# Patient Record
Sex: Male | Born: 1960
Health system: Southern US, Community
[De-identification: ages and names within clinical notes are randomized; demographics above are authoritative.]

## PROBLEM LIST (undated history)

## (undated) DIAGNOSIS — I10 Essential (primary) hypertension: Secondary | ICD-10-CM

## (undated) HISTORY — DX: Essential (primary) hypertension: I10

---

## 2003-09-15 ENCOUNTER — Emergency Department (HOSPITAL_COMMUNITY): Admission: EM | Admit: 2003-09-15 | Discharge: 2003-09-15 | Payer: Self-pay | Admitting: Emergency Medicine

## 2003-10-18 ENCOUNTER — Emergency Department (HOSPITAL_COMMUNITY): Admission: EM | Admit: 2003-10-18 | Discharge: 2003-10-18 | Payer: Self-pay | Admitting: Emergency Medicine

## 2003-10-19 ENCOUNTER — Emergency Department (HOSPITAL_COMMUNITY): Admission: EM | Admit: 2003-10-19 | Discharge: 2003-10-19 | Payer: Self-pay | Admitting: Emergency Medicine

## 2004-06-20 ENCOUNTER — Emergency Department (HOSPITAL_COMMUNITY): Admission: EM | Admit: 2004-06-20 | Discharge: 2004-06-20 | Payer: Self-pay | Admitting: Emergency Medicine

## 2004-07-26 ENCOUNTER — Emergency Department (HOSPITAL_COMMUNITY): Admission: EM | Admit: 2004-07-26 | Discharge: 2004-07-26 | Payer: Self-pay | Admitting: Emergency Medicine

## 2004-07-27 ENCOUNTER — Emergency Department (HOSPITAL_COMMUNITY): Admission: EM | Admit: 2004-07-27 | Discharge: 2004-07-27 | Payer: Self-pay | Admitting: Family Medicine

## 2004-12-18 ENCOUNTER — Emergency Department (HOSPITAL_COMMUNITY): Admission: EM | Admit: 2004-12-18 | Discharge: 2004-12-18 | Payer: Self-pay | Admitting: Emergency Medicine

## 2006-01-07 ENCOUNTER — Emergency Department (HOSPITAL_COMMUNITY): Admission: EM | Admit: 2006-01-07 | Discharge: 2006-01-08 | Payer: Self-pay | Admitting: Emergency Medicine

## 2006-05-26 ENCOUNTER — Emergency Department (HOSPITAL_COMMUNITY): Admission: EM | Admit: 2006-05-26 | Discharge: 2006-05-26 | Payer: Self-pay | Admitting: Emergency Medicine

## 2006-09-16 IMAGING — US US ABDOMEN COMPLETE
1 series · 14 of 25 positions shown · non-contrast
Comparison: none

CLINICAL DATA: Abdominal pain, nausea, vomiting, and bloating.  
 ABDOMINAL ULTRASOUND:
 No gallstones or gallbladder wall thickening.  The wall thickness is 2.7 mm.  Common duct caliber 4.0 mm, well within normal limits.  No hepatic or pancreatic abnormality noted.  Patent IVC.  Spleen difficult to optimally visualize due to adjacent bowel gas.  The spleen measures 7.0 cm in length.  The right and left kidneys measure 10.5 cm and 10.7 cm in length, respectively.  Distal abdominal aorta is not visualized due to adjacent bowel gas and body habitus.  Proximal abdominal aortic caliber 1.9 cm transverse diameter.

[Series 1: unknown · 0.32mm/px · 14 of 82 slices shown]
[im 1/82]
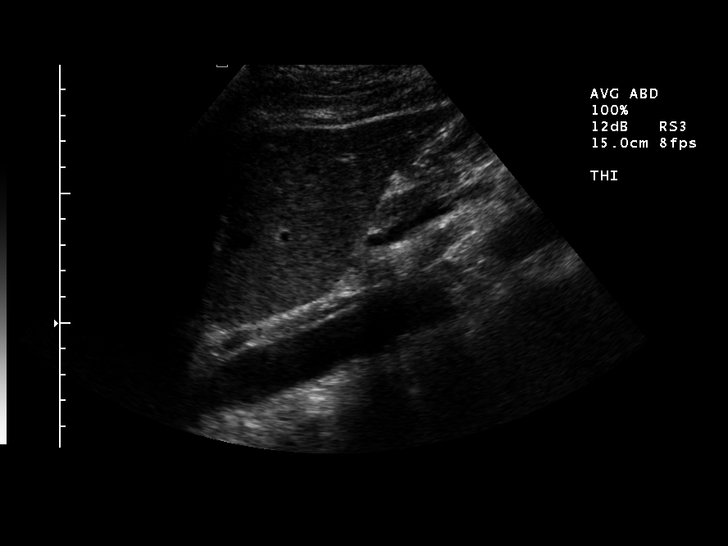
[im 7/82]
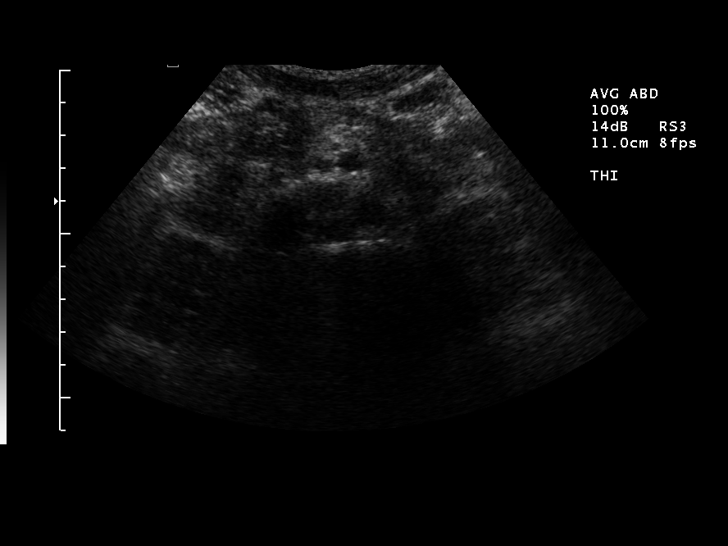
[im 14/82]
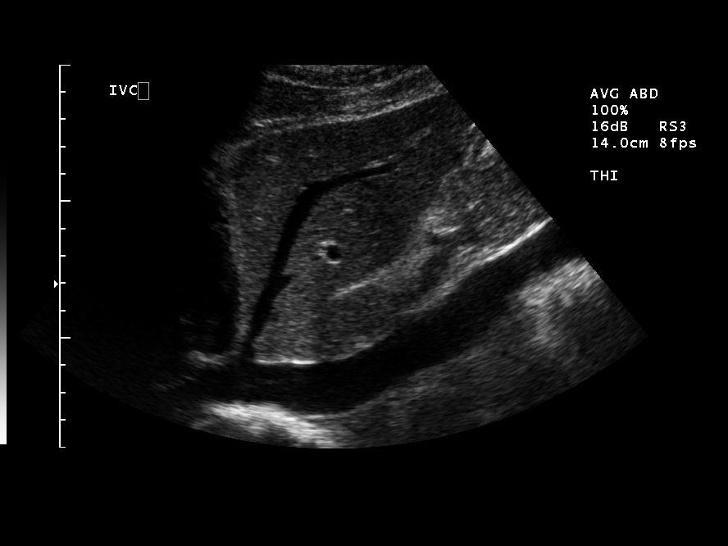
[im 21/82]
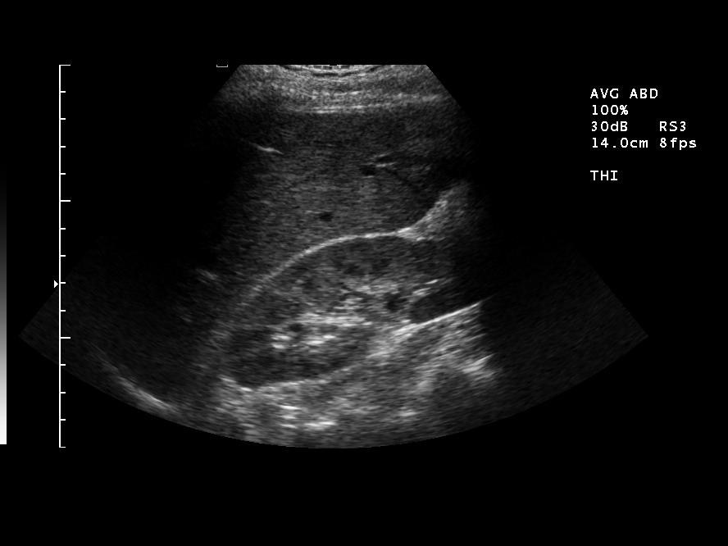
[im 28/82]
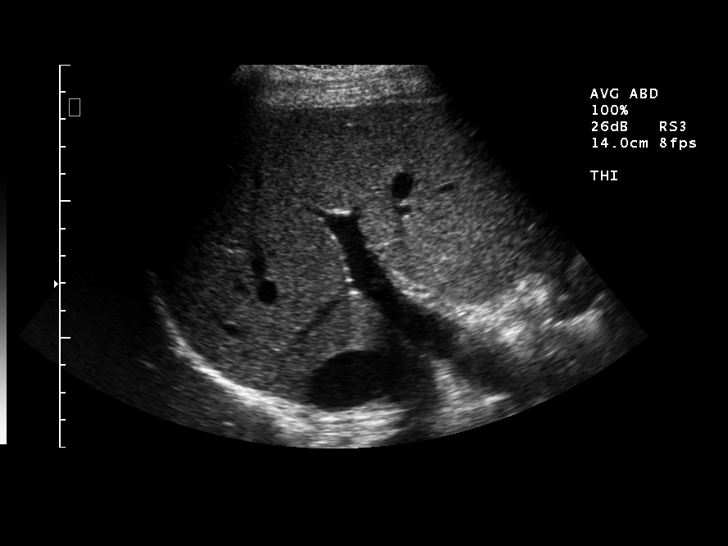
[im 31/82]
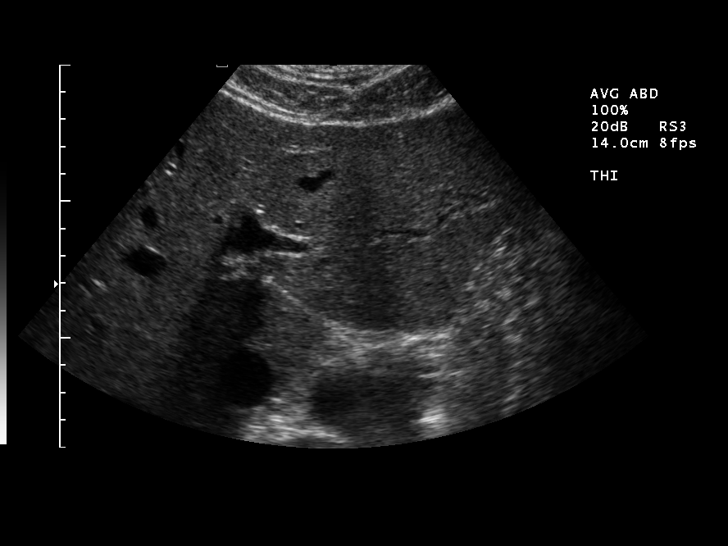
[im 38/82]
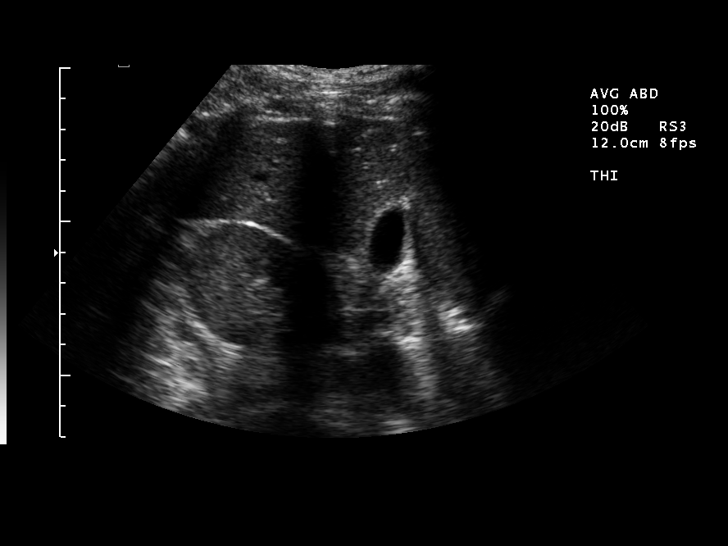
[im 44/82]
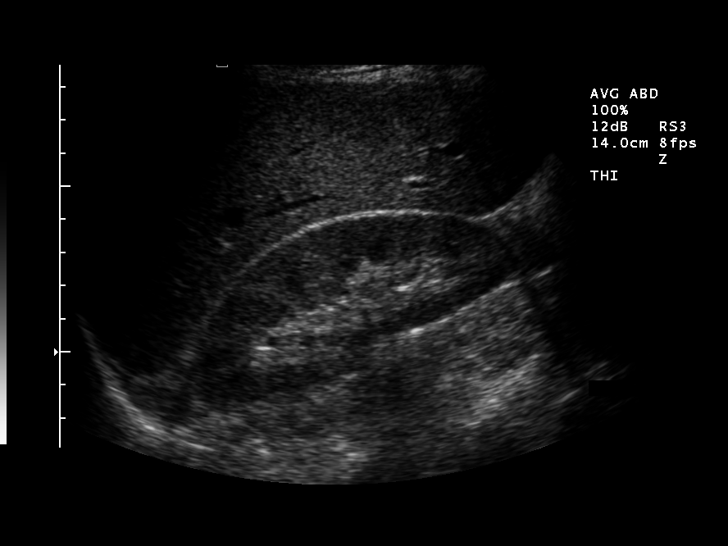
[im 51/82]
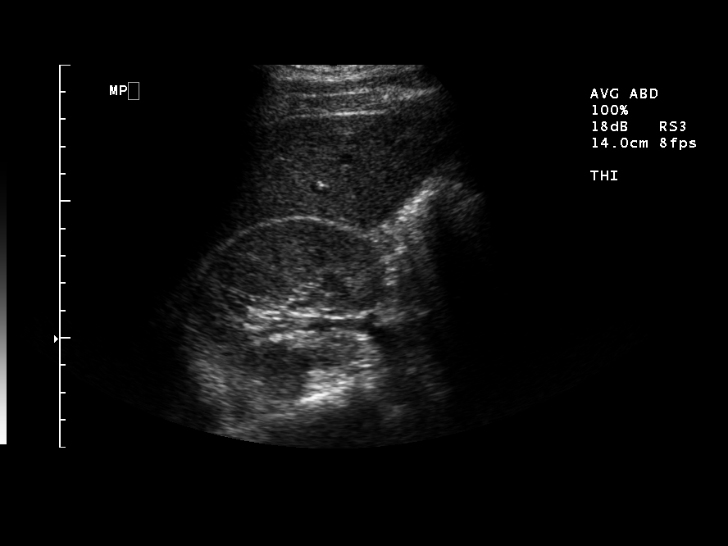
[im 55/82]
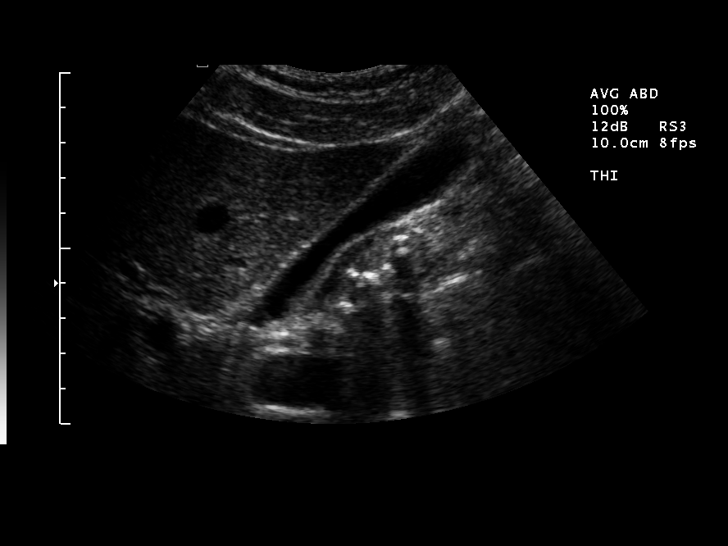
[im 61/82]
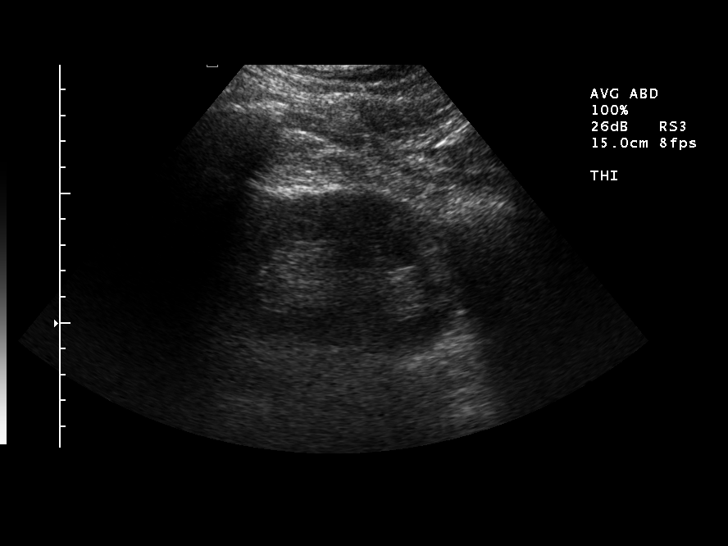
[im 68/82]
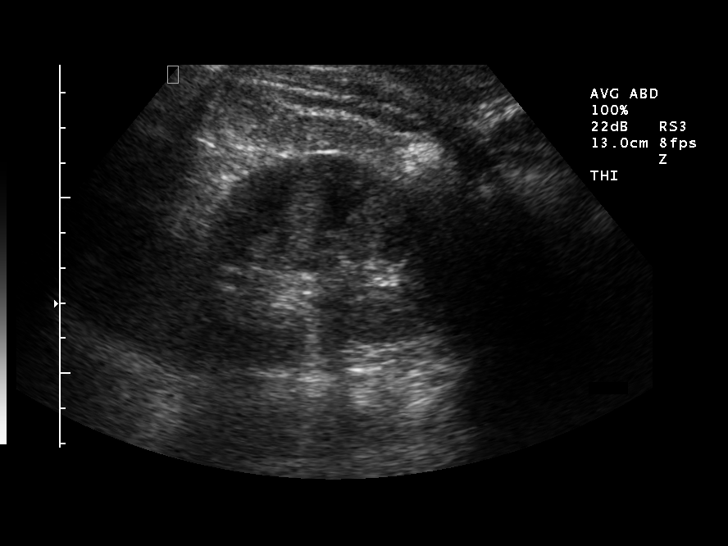
[im 75/82]
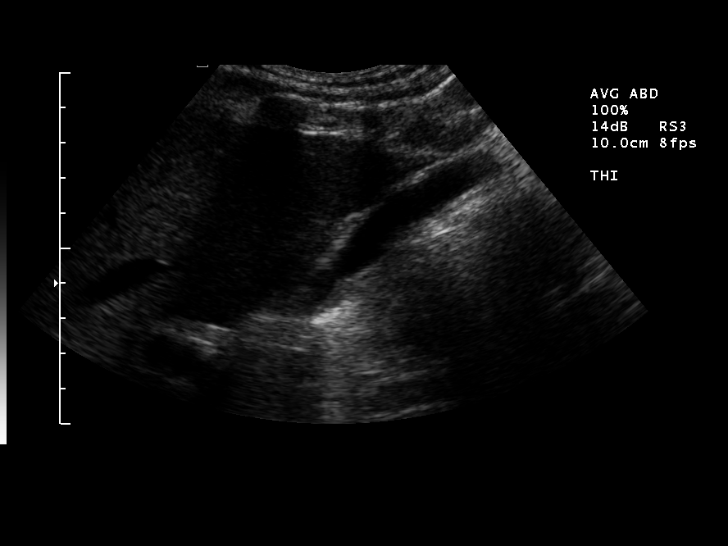
[im 82/82]
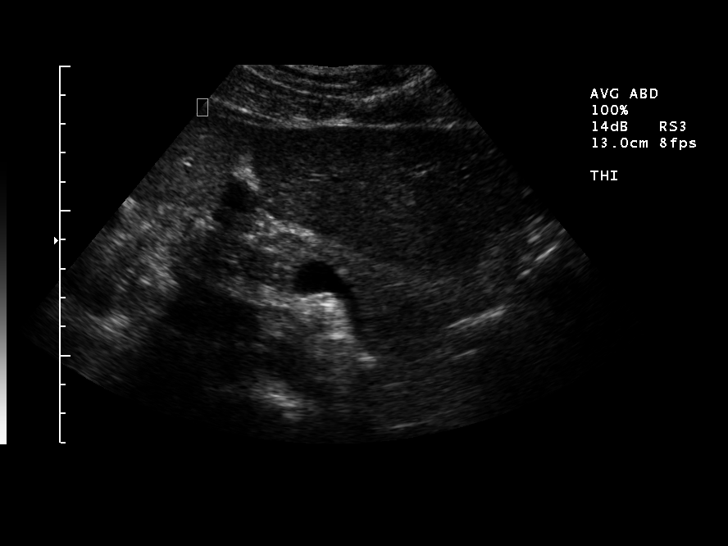

[14 of 25 positions shown; findings below may reference images not displayed]

IMPRESSION: Suboptimal ultrasonic visualization of the spleen.  No acute abnormality is noted.  Unremarkable abdominal ultrasound.

## 2016-09-15 MED FILL — AMLODIPINE BESYLATE 5 MG TA: 5 | 90 days supply | Qty: 90 | Fill #0

## 2016-09-15 MED FILL — TELMISARTAN-HCTZ 40-12.5 MG: 40-12.5 | 90 days supply | Qty: 90 | Fill #0

## 2016-10-04 ENCOUNTER — Other Ambulatory Visit (HOSPITAL_COMMUNITY): Payer: Self-pay | Admitting: Family Medicine

## 2016-10-04 ENCOUNTER — Ambulatory Visit (HOSPITAL_COMMUNITY)
Admission: RE | Admit: 2016-10-04 | Discharge: 2016-10-04 | Disposition: A | Discharge status: Home / Self Care | Payer: No Typology Code available for payment source | Source: Ambulatory Visit | Attending: Vascular Surgery | Admitting: Vascular Surgery

## 2016-10-04 DIAGNOSIS — M79604 Pain in right leg: Secondary | ICD-10-CM | POA: Insufficient documentation

## 2016-10-04 DIAGNOSIS — M79605 Pain in left leg: Secondary | ICD-10-CM | POA: Diagnosis present

## 2016-10-04 DIAGNOSIS — Z720 Tobacco use: Secondary | ICD-10-CM | POA: Insufficient documentation

## 2016-10-04 DIAGNOSIS — I10 Essential (primary) hypertension: Secondary | ICD-10-CM | POA: Insufficient documentation

## 2016-10-04 DIAGNOSIS — R938 Abnormal findings on diagnostic imaging of other specified body structures: Secondary | ICD-10-CM | POA: Insufficient documentation

## 2016-11-10 ENCOUNTER — Other Ambulatory Visit: Payer: Self-pay

## 2016-11-10 DIAGNOSIS — I739 Peripheral vascular disease, unspecified: Secondary | ICD-10-CM

## 2016-12-18 MED FILL — TELMISARTAN-HCTZ 40-12.5 MG: 40-12.5 | 30 days supply | Qty: 30 | Fill #0

## 2016-12-20 ENCOUNTER — Encounter: Payer: Self-pay | Admitting: *Deleted

## 2016-12-20 ENCOUNTER — Other Ambulatory Visit: Payer: Self-pay | Admitting: *Deleted

## 2016-12-20 ENCOUNTER — Encounter: Payer: Self-pay | Admitting: Vascular Surgery

## 2016-12-20 ENCOUNTER — Ambulatory Visit (INDEPENDENT_AMBULATORY_CARE_PROVIDER_SITE_OTHER): Payer: No Typology Code available for payment source | Admitting: Vascular Surgery

## 2016-12-20 ENCOUNTER — Ambulatory Visit (HOSPITAL_COMMUNITY)
Admission: RE | Admit: 2016-12-20 | Discharge: 2016-12-20 | Disposition: A | Discharge status: Home / Self Care | Payer: No Typology Code available for payment source | Source: Ambulatory Visit | Attending: Vascular Surgery | Admitting: Vascular Surgery

## 2016-12-20 VITALS — BP 140/90 | HR 62 | Temp 98.6°F | Resp 20 | Ht 72.0 in | Wt 157.0 lb

## 2016-12-20 DIAGNOSIS — I739 Peripheral vascular disease, unspecified: Secondary | ICD-10-CM | POA: Diagnosis not present

## 2016-12-20 LAB — VAS US LOWER EXTREMITY ARTERIAL DUPLEX
LSFMPSV: -37 cm/s
Left ant tibial distal sys: -18 cm/s
Left super femoral dist sys PSV: -22 cm/s
Left super femoral prox sys PSV: -64 cm/s
RATIBDISTSYS: 49 cm/s
RSFMPSV: -115 cm/s
RTIBDISTSYS: -59 cm/s
Right super femoral dist sys PSV: -88 cm/s
Right super femoral prox sys PSV: 94 cm/s
left post tibial dist sys: -20 cm/s

## 2016-12-20 NOTE — Progress Notes (Signed)
Patient name: Walter Odom MRN: [id number] DOB: 11-23-1960 Sex: male   REASON FOR CONSULT:    Peripheral vascular disease. The consult is requested by Dr. Parke Simmers.   HPI:   Walter Odom is a pleasant 56 y.o. male,  Who developed the fairly sudden onset of left leg claudication proximally 8 months ago. He experiences pain in his left calf which is brought on by ambulation and relieved with rest. This occurs when he is walking upstairs. He works as a Scientist, forensic. 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 He denies any thigh or hip claudication. He denies any symptoms on the right lower extremity. He occasionally gets paresthesias in his left foot at night but denies rest pain or history of nonhealing ulcers. He is currently on no medications.  He also complains of impotence.   His risk factors for peripheral vascular disease include hypertension and tobacco use. He denies any history of diabetes, hypercholesterolemia, or family history of premature cardiovascular disease.  Past Medical History:  Diagnosis Date  . Hypertension     History reviewed. No pertinent family history.  There is no family history of premature cardiovascular disease.  SOCIAL HISTORY: Social History   Social History  . Marital status: Legally Separated    Spouse name: N/A  . Number of children: N/A  . Years of education: N/A   Occupational History  . Not on file.   Social History Main Topics  . Smoking status: Current Some Day Smoker    Packs/day: 1.50  . Smokeless tobacco: Never Used  . Alcohol use Yes  . Drug use: Unknown  . Sexual activity: Not on file   Other Topics Concern  . Not on file   Social History Narrative  . No narrative on file    No Known Allergies  No current outpatient prescriptions on file.   No current facility-administered medications for this visit.     REVIEW OF SYSTEMS:   denotes positive finding,  denotes negative  finding Cardiac  Comments:  Chest pain or chest pressure:    Shortness of breath upon exertion:    Short of breath when lying flat:    Irregular heart rhythm:        Vascular    Pain in calf, thigh, or hip brought on by ambulation: X   Pain in feet at night that wakes you up from your sleep:     Blood clot in your veins:    Leg swelling:         Pulmonary    Oxygen at home:    Productive cough:     Wheezing:         Neurologic    Sudden weakness in arms or legs:     Sudden numbness in arms or legs:  X Positional paresthesias and upper extremities.   Sudden onset of difficulty speaking or slurred speech:    Temporary loss of vision in one eye:     Problems with dizziness:         Gastrointestinal    Blood in stool:     Vomited blood:         Genitourinary    Burning when urinating:     Blood in urine:        Psychiatric    Major depression:         Hematologic    Bleeding problems:    Problems with blood clotting too easily:  Skin    Rashes or ulcers:        Constitutional    Fever or chills:     PHYSICAL EXAM:   Vitals:   12/20/16 0853  BP: 140/90  Pulse: 62  Resp: 20  Temp: 98.6 F (37 C)  TempSrc: Oral  SpO2: 98%  Weight: 157 lb (71.2 kg)  Height: 6' (1.829 m)    GENERAL: The patient is a well-nourished male, in no acute distress. The vital signs are documented above. CARDIAC: There is a regular rate and rhythm.  VASCULAR: I do not detect carotid bruits. He has slightly diminished femoral pulses. On the left side, which is the symptomatic side, I cannot palpate a popliteal, posterior tibial, or dorsalis pedis pulse. On the right side he has a palpable posterior tibial pulse. He has no significant lower extremity swelling. PULMONARY: There is good air exchange bilaterally without wheezing or rales. ABDOMEN: Soft and non-tender with normal pitched bowel sounds. I cannot palpate an abdominal aortic aneurysm. MUSCULOSKELETAL: There are no  major deformities or cyanosis. NEUROLOGIC: No focal weakness or paresthesias are detected. SKIN: There are no ulcers or rashes noted. PSYCHIATRIC: The patient has a normal affect.  DATA:    ARTERIAL DOPPLER STUDY: I reviewed the arterial Doppler study that was done on 10/04/2016.  On the right side there was a triphasic dorsalis pedis and posterior tibial signal with an ABI on 100%. Toe pressure on the right was 104 mmHg.  On the left side, which is the symptomatic side, there was a monophasic posterior tibial signal with a biphasic dorsalis pedis signal. ABI was 84% with a toe pressure of 83 mmHg.  ARTERIAL DUPLEX STUDY: I have independently interpreted his arterial duplex study today.  On the right side there are triphasic waveforms from the common femoral artery down through the tibial vessels.  On the left side, which is the symptomatic side, there is a triphasic waveform in the common femoral artery, deep femoral artery, and proximal superficial femoral artery. There are monophasic signals from the distal superficial femoral artery and beyond. There appears to be a 4 cm occlusion of the left superficial femoral artery  MEDICAL ISSUES:   PERIPHERAL VASCULAR DISEASE: This patient has an occluded left superficial femoral artery. He has stable claudication. 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 We discussed conservative treatment including the importance of tobacco cessation. I spent more than 3 minutes on a conversation. In addition we discussed a structured walking program and the importance of nutrition. He feels that his symptoms are significantly limiting his ability to work and therefore he would like to pursue arteriography.  I have reviewed with the patient the indications for arteriography. In addition, I have reviewed the potential complications of arteriography including but not limited to: Bleeding, arterial injury, arterial thrombosis, dye action,  renal insufficiency, or other unpredictable medical problems. I have explained to the patient that if we find disease amenable to angioplasty we could potentially address this at the same time. I have discussed the potential complications of angioplasty and stenting, including but not limited to: Bleeding, arterial thrombosis, arterial injury, dissection, or the need for surgical intervention.  He is currently not on aspirin and I have instructed him to begin taking aspirin. I think he would also benefit from a low-dose statin that he would like to discuss this with his primary care physician.  His arteriogram is scheduled for 12/25/2016. If the left superficial femoral artery can  be addressed with balloon angioplasty then he will need to be on Plavix after the procedure. If he is not a candidate for an endovascular approach we can discuss potential bypass grafting in the left leg.  IMPOTENCE: He complains of evidence and once to it this could potentially be related to his peripheral vascular disease. I've explained that we will certainly perform a full aortogram to evaluate the pelvic circulation and could make further recommendations after that. If pelvic circulation is not the issue then certainly he could be referred to urology.  Waverly Ferrari [facility] [place] [phone]

## 2017-01-08 ENCOUNTER — Ambulatory Visit (HOSPITAL_COMMUNITY)
Admission: RE | Admit: 2017-01-08 | Discharge: 2017-01-08 | Disposition: A | Discharge status: Home / Self Care | Payer: No Typology Code available for payment source | Source: Ambulatory Visit | Attending: Vascular Surgery | Admitting: Vascular Surgery

## 2017-01-08 ENCOUNTER — Encounter (HOSPITAL_COMMUNITY)
Admission: RE | Disposition: A | Discharge status: Home / Self Care | Payer: Self-pay | Source: Ambulatory Visit | Attending: Vascular Surgery

## 2017-01-08 ENCOUNTER — Telehealth: Payer: Self-pay | Admitting: Vascular Surgery

## 2017-01-08 DIAGNOSIS — I739 Peripheral vascular disease, unspecified: Secondary | ICD-10-CM | POA: Diagnosis present

## 2017-01-08 DIAGNOSIS — I70212 Atherosclerosis of native arteries of extremities with intermittent claudication, left leg: Secondary | ICD-10-CM | POA: Insufficient documentation

## 2017-01-08 DIAGNOSIS — F17210 Nicotine dependence, cigarettes, uncomplicated: Secondary | ICD-10-CM | POA: Diagnosis not present

## 2017-01-08 DIAGNOSIS — I10 Essential (primary) hypertension: Secondary | ICD-10-CM | POA: Diagnosis not present

## 2017-01-08 HISTORY — PX: ABDOMINAL AORTOGRAM W/LOWER EXTREMITY: CATH118223

## 2017-01-08 HISTORY — PX: PERIPHERAL VASCULAR BALLOON ANGIOPLASTY: CATH118281

## 2017-01-08 LAB — POCT ACTIVATED CLOTTING TIME
ACTIVATED CLOTTING TIME: 202 s
Activated Clotting Time: 213 seconds
Activated Clotting Time: 169 seconds

## 2017-01-08 LAB — POCT I-STAT, CHEM 8
BUN: 10 mg/dL (ref 6–20)
CALCIUM ION: 1.16 mmol/L (ref 1.15–1.40)
Chloride: 101 mmol/L (ref 101–111)
Creatinine, Ser: 0.8 mg/dL (ref 0.61–1.24)
GLUCOSE: 101 mg/dL — AB (ref 65–99)
HCT: 45 % (ref 39.0–52.0)
Hemoglobin: 15.3 g/dL (ref 13.0–17.0)
Potassium: 3.6 mmol/L (ref 3.5–5.1)
SODIUM: 139 mmol/L (ref 135–145)
TCO2: 28 mmol/L (ref 22–32)

## 2017-01-08 SURGERY — ABDOMINAL AORTOGRAM W/LOWER EXTREMITY
Anesthesia: LOCAL

## 2017-01-08 MED ORDER — SODIUM CHLORIDE 0.9% FLUSH: 3.0000 mL | Freq: Two times a day (BID) | INTRAVENOUS | Status: DC

## 2017-01-08 MED ORDER — IODIXANOL 320 MG/ML IV SOLN
INTRAVENOUS | Status: DC | PRN
  Administered 2017-01-08: 135 mL via INTRA_ARTERIAL

## 2017-01-08 MED ORDER — MIDAZOLAM HCL 2 MG/2ML IJ SOLN
INTRAMUSCULAR | Status: AC
  Filled 2017-01-08: qty 2

## 2017-01-08 MED ORDER — HYDRALAZINE HCL 20 MG/ML IJ SOLN: 5.0000 mg | INTRAMUSCULAR | Status: DC | PRN

## 2017-01-08 MED ORDER — FENTANYL CITRATE (PF) 100 MCG/2ML IJ SOLN
INTRAMUSCULAR | Status: AC
  Filled 2017-01-08: qty 2

## 2017-01-08 MED ORDER — SODIUM CHLORIDE 0.9 % IV SOLN: 250.0000 mL | INTRAVENOUS | Status: DC | PRN

## 2017-01-08 MED ORDER — HEPARIN SODIUM (PORCINE) 1000 UNIT/ML IJ SOLN
INTRAMUSCULAR | Status: DC | PRN
  Administered 2017-01-08: 6000 [IU] via INTRAVENOUS
  Administered 2017-01-08: 1000 [IU] via INTRAVENOUS

## 2017-01-08 MED ORDER — LIDOCAINE HCL 2 % IJ SOLN
INTRAMUSCULAR | Status: DC | PRN
  Administered 2017-01-08: 10 mL

## 2017-01-08 MED ORDER — SODIUM CHLORIDE 0.9% FLUSH: 3.0000 mL | INTRAVENOUS | Status: DC | PRN

## 2017-01-08 MED ORDER — MIDAZOLAM HCL 2 MG/2ML IJ SOLN
INTRAMUSCULAR | Status: DC | PRN
  Administered 2017-01-08: 1 mg via INTRAVENOUS

## 2017-01-08 MED ORDER — LIDOCAINE HCL 2 % IJ SOLN
INTRAMUSCULAR | Status: AC
  Filled 2017-01-08: qty 20

## 2017-01-08 MED ORDER — FENTANYL CITRATE (PF) 100 MCG/2ML IJ SOLN
INTRAMUSCULAR | Status: DC | PRN
  Administered 2017-01-08: 50 ug via INTRAVENOUS

## 2017-01-08 MED ORDER — LABETALOL HCL 5 MG/ML IV SOLN: 10.0000 mg | INTRAVENOUS | Status: DC | PRN

## 2017-01-08 MED ORDER — HEPARIN (PORCINE) IN NACL 2-0.9 UNIT/ML-% IJ SOLN
INTRAMUSCULAR | Status: AC | PRN
  Administered 2017-01-08: 1000 mL via INTRA_ARTERIAL

## 2017-01-08 MED ORDER — SODIUM CHLORIDE 0.9 % IV SOLN
INTRAVENOUS | Status: DC
  Administered 2017-01-08: 06:00:00 via INTRAVENOUS

## 2017-01-08 MED ORDER — HEPARIN SODIUM (PORCINE) 1000 UNIT/ML IJ SOLN
INTRAMUSCULAR | Status: AC
  Filled 2017-01-08: qty 1

## 2017-01-08 MED ORDER — SODIUM CHLORIDE 0.9 % WEIGHT BASED INFUSION: 1.0000 mL/kg/h | INTRAVENOUS | Status: DC

## 2017-01-08 SURGICAL SUPPLY — 16 items
CATH ANGIO 5F PIGTAIL 65CM (CATHETERS) ×1 IMPLANT
CATH CROSS OVER TEMPO 5F (CATHETERS) ×1 IMPLANT
CATH QUICKCROSS .035X135CM (MICROCATHETER) ×1 IMPLANT
CATH STRAIGHT 5FR 65CM (CATHETERS) ×1 IMPLANT
COVER PRB 48X5XTLSCP FOLD TPE (BAG) IMPLANT
COVER PROBE 5X48 (BAG) ×3
GUIDEWIRE ANGLED .035X260CM (WIRE) ×1 IMPLANT
KIT MICROINTRODUCER STIFF 5F (SHEATH) ×1 IMPLANT
KIT PV (KITS) ×3 IMPLANT
SHEATH FLEX ANSEL ST 6FR 45CM (SHEATH) ×1 IMPLANT
SHEATH PINNACLE 5F 10CM (SHEATH) ×1 IMPLANT
SYR MEDRAD MARK V 150ML (SYRINGE) ×3 IMPLANT
TAPE RADIOPAQUE TURBO (MISCELLANEOUS) ×1 IMPLANT
TRANSDUCER W/STOPCOCK (MISCELLANEOUS) ×3 IMPLANT
TRAY PV CATH (CUSTOM PROCEDURE TRAY) ×3 IMPLANT
WIRE HITORQ VERSACORE ST 145CM (WIRE) ×1 IMPLANT

## 2017-01-08 NOTE — Telephone Encounter (Signed)
Sched apt 04/11/16; lab at 12:00 and MD at 12:30. Mailed appt letter.

## 2017-01-08 NOTE — Telephone Encounter (Signed)
-----   Message from Sharee PimpleMarilyn K McChesney, RN sent at 01/08/2017  9:33 AM EDT ----- Regarding: 3 months w/ vein map left GSV    ----- Message ----- From: Chuck Hintickson, Christopher S, MD Sent: 01/08/2017   9:16 AM To: Vvs Charge Pool Subject: charge and f/u                                 PROCEDURE:   1.  Conscious sedation 2.  Ultrasound-guided access to the right common femoral artery 3.  Attempted angioplasty of the left superficial femoral artery 4.  Left lower extremity runoff 5.  Retrograde right femoral arteriogram with right lower extremity runoff  SURGEON: Di Kindlehristopher S. Edilia Boickson, MD, FACS  He will need a follow-up visit in 3 months with a vein map of his left great saphenous vein.  Thank you. CD

## 2017-01-08 NOTE — H&P (View-Only) (Signed)
  Patient name: Walter Odom MRN: [id number] DOB: 01/10/1961 Sex: male   REASON FOR CONSULT:    Peripheral vascular disease. The consult is requested by Dr. Bland.   HPI:   Walter Odom is a pleasant 56 y.o. male,  Who developed the fairly sudden onset of left leg claudication proximally 8 months ago. He experiences pain in his left calf which is brought on by ambulation and relieved with rest. This occurs when he is walking upstairs. 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 He denies any thigh or hip claudication. He denies any symptoms on the right lower extremity. He occasionally gets paresthesias in his left foot at night but denies rest pain or history of nonhealing ulcers. He is currently on no medications.  He also complains of impotence.   His risk factors for peripheral vascular disease include hypertension and tobacco use. He denies any history of diabetes, hypercholesterolemia, or family history of premature cardiovascular disease.  Past Medical History:  Diagnosis Date  . Hypertension     History reviewed. No pertinent family history.  There is no family history of premature cardiovascular disease.  SOCIAL HISTORY: Social History   Social History  . Marital status: Legally Separated    Spouse name: N/A  . Number of children: N/A  . Years of education: N/A   Occupational History  . Not on file.   Social History Main Topics  . Smoking status: Current Some Day Smoker    Packs/day: 1.50  . Smokeless tobacco: Never Used  . Alcohol use Yes  . Drug use: Unknown  . Sexual activity: Not on file   Other Topics Concern  . Not on file   Social History Narrative  . No narrative on file    No Known Allergies  No current outpatient prescriptions on file.   No current facility-administered medications for this visit.     REVIEW OF SYSTEMS:  [X] denotes positive finding, [ ] denotes negative  finding Cardiac  Comments:  Chest pain or chest pressure:    Shortness of breath upon exertion:    Short of breath when lying flat:    Irregular heart rhythm:        Vascular    Pain in calf, thigh, or hip brought on by ambulation: X   Pain in feet at night that wakes you up from your sleep:     Blood clot in your veins:    Leg swelling:         Pulmonary    Oxygen at home:    Productive cough:     Wheezing:         Neurologic    Sudden weakness in arms or legs:     Sudden numbness in arms or legs:  X Positional paresthesias and upper extremities.   Sudden onset of difficulty speaking or slurred speech:    Temporary loss of vision in one eye:     Problems with dizziness:         Gastrointestinal    Blood in stool:     Vomited blood:         Genitourinary    Burning when urinating:     Blood in urine:        Psychiatric    Major depression:         Hematologic    Bleeding problems:    Problems with blood clotting too easily:          Skin    Rashes or ulcers:        Constitutional    Fever or chills:     PHYSICAL EXAM:   Vitals:   12/20/16 0853  BP: 140/90  Pulse: 62  Resp: 20  Temp: 98.6 F (37 C)  TempSrc: Oral  SpO2: 98%  Weight: 157 lb (71.2 kg)  Height: 6' (1.829 m)    GENERAL: The patient is a well-nourished male, in no acute distress. The vital signs are documented above. CARDIAC: There is a regular rate and rhythm.  VASCULAR: I do not detect carotid bruits. He has slightly diminished femoral pulses. On the left side, which is the symptomatic side, I cannot palpate a popliteal, posterior tibial, or dorsalis pedis pulse. On the right side he has a palpable posterior tibial pulse. He has no significant lower extremity swelling. PULMONARY: There is good air exchange bilaterally without wheezing or rales. ABDOMEN: Soft and non-tender with normal pitched bowel sounds. I cannot palpate an abdominal aortic aneurysm. MUSCULOSKELETAL: There are no  major deformities or cyanosis. NEUROLOGIC: No focal weakness or paresthesias are detected. SKIN: There are no ulcers or rashes noted. PSYCHIATRIC: The patient has a normal affect.  DATA:    ARTERIAL DOPPLER STUDY: I reviewed the arterial Doppler study that was done on 10/04/2016.  On the right side there was a triphasic dorsalis pedis and posterior tibial signal with an ABI on 100%. Toe pressure on the right was 104 mmHg.  On the left side, which is the symptomatic side, there was a monophasic posterior tibial signal with a biphasic dorsalis pedis signal. ABI was 84% with a toe pressure of 83 mmHg.  ARTERIAL DUPLEX STUDY: I have independently interpreted his arterial duplex study today.  On the right side there are triphasic waveforms from the common femoral artery down through the tibial vessels.  On the left side, which is the symptomatic side, there is a triphasic waveform in the common femoral artery, deep femoral artery, and proximal superficial femoral artery. There are monophasic signals from the distal superficial femoral artery and beyond. There appears to be a 4 cm occlusion of the left superficial femoral artery  MEDICAL ISSUES:   PERIPHERAL VASCULAR DISEASE: This patient has an occluded left superficial femoral artery. He has stable claudication. 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 We discussed conservative treatment including the importance of tobacco cessation. I spent more than 3 minutes on a conversation. In addition we discussed a structured walking program and the importance of nutrition. He feels that his symptoms are significantly limiting his ability to work and therefore he would like to pursue arteriography.  I have reviewed with the patient the indications for arteriography. In addition, I have reviewed the potential complications of arteriography including but not limited to: Bleeding, arterial injury, arterial thrombosis, dye action,  renal insufficiency, or other unpredictable medical problems. I have explained to the patient that if we find disease amenable to angioplasty we could potentially address this at the same time. I have discussed the potential complications of angioplasty and stenting, including but not limited to: Bleeding, arterial thrombosis, arterial injury, dissection, or the need for surgical intervention.  He is currently not on aspirin and I have instructed him to begin taking aspirin. I think he would also benefit from a low-dose statin that he would like to discuss this with his primary care physician.  His arteriogram is scheduled for 12/25/2016. If the left superficial femoral artery can   be addressed with balloon angioplasty then he will need to be on Plavix after the procedure. If he is not a candidate for an endovascular approach we can discuss potential bypass grafting in the left leg.  IMPOTENCE: He complains of evidence and once to it this could potentially be related to his peripheral vascular disease. I've explained that we will certainly perform a full aortogram to evaluate the pelvic circulation and could make further recommendations after that. If pelvic circulation is not the issue then certainly he could be referred to urology.  Ravyn Nikkel [facility] [place] [facility] [phone] 

## 2017-01-08 NOTE — Discharge Instructions (Signed)

## 2017-01-08 NOTE — Interval H&P Note (Signed)
History and Physical Interval Note:  01/08/2017 7:24 AM  Walter Odom  has presented today for surgery, with the diagnosis of pad  The various methods of treatment have been discussed with the patient and family. After consideration of risks, benefits and other options for treatment, the patient has consented to  Procedure(s): ABDOMINAL AORTOGRAM W/LOWER EXTREMITY (N/A) as a surgical intervention .  The patient's history has been reviewed, patient examined, no change in status, stable for surgery.  I have reviewed the patient's chart and labs.  Questions were answered to the patient's satisfaction.     Waverly Ferrariickson, Mayjor Ager

## 2017-01-08 NOTE — Progress Notes (Signed)
Site area: Right groin 6 french arterial sheath was removed  Site Prior to Removal:  Level 0  Pressure Applied For 20 MINUTES     Bedrest Beginning at 1000am  Manual:   Yes.    Patient Status During Pull:  stable  Post Pull Groin Site:  Level 0  Post Pull Instructions Given:  Yes.    Post Pull Pulses Present:  Yes.    Dressing Applied:  Yes.    Comments:  VS remain stable during sheath

## 2017-01-08 NOTE — Op Note (Signed)
PATIENT: Walter Odom      MRN: [id number] DOB: [birth year]    DATE OF PROCEDURE: 01/08/2017  INDICATIONS:    Walter Odom is a 56 y.o. male who presented with left lower extremity claudication.  He denied rest pain or nonhealing ulcers.  He is a smoker.  We discussed conservative treatment however he felt that his symptoms were significantly disabling and wished to pursue arteriography and possible intervention.  PROCEDURE:    1.  Conscious sedation 2.  Ultrasound-guided access to the right common femoral artery 3.  Attempted angioplasty of the left superficial femoral artery 4.  Left lower extremity runoff 5.  Retrograde right femoral arteriogram with right lower extremity runoff  SURGEON: Di Kindle. Edilia Bo, MD, FACS  ANESTHESIA: Local with sedation  EBL: Minimal  TECHNIQUE: The patient was taken to the peripheral vascular lab and was sedated. The period of conscious sedation was 70 minutes.  During that time period, I was present face-to-face 100% of the time.  The patient was administered 1 mg of Versed and 50 mcg of fentanyl. The patient's heart rate, blood pressure, and oxygen saturation were monitored by the nurse continuously during the procedure.  Both groins were prepped and draped in usual sterile fashion.  Under ultrasound guidance, after the skin was anesthetized, the right common femoral artery was cannulated with a micropuncture needle and micropuncture sheath introduced over a wire.  This was exchanged for a 5 [place] sheath over a Bentson wire.  Pigtail catheter was positioned at the L1 vertebral body and flush aortogram obtained.  Catheter was in position above the aortic bifurcation and an oblique iliac projection was obtained in an RAO projection.  I then exchanged the pigtail catheter for a crossover catheter which was positioned into the proximal left common iliac artery.  I advanced an angled Glidewire down to the external iliac artery and exchanged the crossover  catheter for a straight catheter.  I then selectively cannulated the left superficial femoral artery.  I exchanged wires for the Bentson wire.  The 5 French sheath was exchanged for a 6 [place] Ansel sheath which was positioned down into the left superficial femoral artery.  Left lower extremity arteriogram was obtained and there was an occlusion in the left superficial femoral artery.  There was also disease above this level.  Using a quick cross catheter and the angled Glidewire was able ultimately to get down below the occlusion into the superficial femoral artery.  Position was confirmed with contrast injection.  The catheter then pulled back and we were unable to get back through the lesion safely.  The sheath was retracted to the right external iliac artery and the right lower extremity runoff film was obtained.  The patient was transferred to the holding area for removal of the sheath.  No immediate complications were noted.   FINDINGS:    1.  There are single renal arteries bilaterally with no significant renal artery stenosis identified.  The infrarenal aorta is widely patent.  Bilateral common iliacs, external iliacs and hypogastric arteries are patent. 2.  On the right side which is the symptomatic side, common femoral and deep femoral artery are patent.  The proximal superficial femoral artery is patent.  There is an occlusion in the midportion of the superficial femoral artery with reconstitution of the popliteal artery above the knee.  There is three-vessel runoff on the left via the anterior tibial, posterior tibial, and peroneal arteries which are all small.  The dorsalis pedis on the  left is occluded.  There are extensive collaterals suggesting this is chronic.  Of note the superficial femoral artery in its largest point measures 3.2 mm.  I was unable to successfully address this with angioplasty. 3.  On the right side the common femoral, deep femoral and superficial femoral arteries are  patent.  The popliteal artery is patent.  There is three-vessel runoff on the right via the anterior tibial, posterior tibial peroneal arteries.  The anterior tibial artery is quite small in size.  CLINICAL NOTE: I will plan on seeing the patient back in the office in 3 months.  He has not improved with conservative treatment and the options with the reattempting angioplasty of the right superficial femoral artery stenosis.  The artery was fairly small but we could potentially predilate this with a 3 mm balloon and then use smallest drug-coated balloon which is a 4 mm balloon.  This would require fairly long length given the disease above the occlusion.  Alternatively he could be considered for a left femoral to popliteal artery bypass.  I will have his vein map when he returns for his follow-up visit.  Walter Ferrarihristopher Darshana Curnutt, MD, FACS [facility]  DATE OF DICTATION:   01/08/2017

## 2017-01-09 ENCOUNTER — Encounter (HOSPITAL_COMMUNITY): Payer: Self-pay | Admitting: Vascular Surgery

## 2017-01-17 ENCOUNTER — Encounter: Payer: Self-pay | Admitting: Vascular Surgery

## 2017-01-25 MED FILL — AMLODIPINE BESYLATE 5 MG TA: 5 | 30 days supply | Qty: 30 | Fill #0

## 2017-01-25 MED FILL — TELMISARTAN-HCTZ 40-12.5 MG: 40-12.5 | 30 days supply | Qty: 30 | Fill #0

## 2017-03-01 ENCOUNTER — Other Ambulatory Visit: Payer: Self-pay

## 2017-03-01 DIAGNOSIS — I739 Peripheral vascular disease, unspecified: Secondary | ICD-10-CM

## 2017-03-02 MED FILL — TELMISARTAN-HCTZ 40-12.5 MG: 40-12.5 | 60 days supply | Qty: 60 | Fill #1

## 2017-03-02 MED FILL — ROSUVASTATIN CALCIUM 20 MG: 20 | 30 days supply | Qty: 30 | Fill #0

## 2017-04-11 ENCOUNTER — Ambulatory Visit: Payer: No Typology Code available for payment source | Admitting: Vascular Surgery

## 2017-04-11 ENCOUNTER — Encounter (HOSPITAL_COMMUNITY): Payer: No Typology Code available for payment source

## 2017-06-05 MED FILL — ROSUVASTATIN CALCIUM 20 MG: 20 | 30 days supply | Qty: 30 | Fill #1

## 2017-06-05 MED FILL — AMLODIPINE BESYLATE 5 MG TA: 5 | 30 days supply | Qty: 30 | Fill #1

## 2017-06-06 MED FILL — TELMISARTAN-HCTZ 40-12.5 MG: 40-12.5 | 30 days supply | Qty: 30 | Fill #0

## 2018-09-23 MED FILL — AMOXICILLIN 500 MG CAPSULE: 500 | 7 days supply | Qty: 21 | Fill #0

## 2018-09-23 MED FILL — IBUPROFEN 800 MG TABS: 800 | 5 days supply | Qty: 15 | Fill #0

## 2021-04-18 ENCOUNTER — Other Ambulatory Visit: Payer: Self-pay

## 2021-04-18 ENCOUNTER — Other Ambulatory Visit (HOSPITAL_COMMUNITY): Payer: Self-pay

## 2021-04-18 ENCOUNTER — Encounter: Payer: Self-pay | Admitting: Family

## 2021-04-18 ENCOUNTER — Ambulatory Visit: Payer: 59 | Admitting: Family

## 2021-04-18 VITALS — BP 158/96 | HR 70 | Temp 98.2°F | Ht 72.0 in | Wt 155.4 lb

## 2021-04-18 DIAGNOSIS — F17210 Nicotine dependence, cigarettes, uncomplicated: Secondary | ICD-10-CM | POA: Insufficient documentation

## 2021-04-18 DIAGNOSIS — I739 Peripheral vascular disease, unspecified: Secondary | ICD-10-CM | POA: Diagnosis not present

## 2021-04-18 DIAGNOSIS — I10 Essential (primary) hypertension: Secondary | ICD-10-CM | POA: Diagnosis not present

## 2021-04-18 DIAGNOSIS — M545 Low back pain, unspecified: Secondary | ICD-10-CM | POA: Insufficient documentation

## 2021-04-18 DIAGNOSIS — N529 Male erectile dysfunction, unspecified: Secondary | ICD-10-CM

## 2021-04-18 MED ORDER — AMLODIPINE BESYLATE 10 MG PO TABS
10.0000 mg | ORAL_TABLET | Freq: Every day | ORAL | 1 refills | Status: DC
  Filled 2021-04-18: qty 90, 90d supply, fill #0
  Filled 2021-07-20: qty 90, 90d supply, fill #1

## 2021-04-18 MED ORDER — METHOCARBAMOL 500 MG PO TABS
500.0000 mg | ORAL_TABLET | Freq: Three times a day (TID) | ORAL | 0 refills | Status: DC | PRN
  Filled 2021-04-18: qty 60, 10d supply, fill #0

## 2021-04-18 MED ORDER — TADALAFIL 5 MG PO TABS
5.0000 mg | ORAL_TABLET | Freq: Every day | ORAL | 0 refills | Status: DC
  Filled 2021-04-18: qty 30, 15d supply, fill #0

## 2021-04-18 MED ORDER — MELOXICAM 7.5 MG PO TABS
7.5000 mg | ORAL_TABLET | Freq: Every day | ORAL | 0 refills | Status: DC
  Filled 2021-04-18: qty 30, 30d supply, fill #0

## 2021-04-18 MED ORDER — VARENICLINE TARTRATE 0.5 MG PO TABS
0.5000 mg | ORAL_TABLET | Freq: Two times a day (BID) | ORAL | 5 refills | Status: DC
  Filled 2021-04-18: qty 60, 30d supply, fill #0
  Filled 2021-05-27: qty 60, 30d supply, fill #1

## 2021-04-18 NOTE — Assessment & Plan Note (Signed)
reports seeing vascular in 2018 for angioplasty but they were unable to get small enough balloon inserted, pain in left leg has worsened, he is requesting referral.

## 2021-04-18 NOTE — Assessment & Plan Note (Addendum)
Chronic - Unstable- no tx for last 2 years. Denies sx, restarting Amlodipine 10mg  qd. Advised pt on use & SE, drink plenty of water, low sodium diet. f/u in 1 mos.

## 2021-04-18 NOTE — Assessment & Plan Note (Signed)
starting low dose Cialis, advised on smoking cessation, return in 1 mo for lab work.

## 2021-04-18 NOTE — Assessment & Plan Note (Signed)
pt lifts heavy furniture all day, reports having pain off & on for years, was on left side, now right side, worsened 2 weeks ago, has not tried OTC meds, sending Robaxin & Mobic, advised on use & SE, f/u in 1 mo.

## 2021-04-18 NOTE — Patient Instructions (Addendum)
Welcome to [facility]! It was a pleasure meeting you today.  As discussed, I have sent your blood pressure medication to your pharmacy, START THIS TODAY. Drink at least 2 liters of water daily & reduce the salt = sodium in your diet. I have sent a referral to Vascular surgery regarding your leg pain. I also sent generic Chantix to help you stop smoking. Start this tomorrow morning. If having side effects when you increase the dose, then go back down on the dose.  Please schedule a 1 month follow up visit today with fasting labs.    PLEASE NOTE:  If you had any LAB tests please let us know if you have not heard back within a few days. You may see your results on MyChart before we have a chance to review them but we will give you a call once they are reviewed by [place]. If we ordered any REFERRALS today, please let us know if you have not heard from their office within the next week.  Let us know through MyChart if you are needing REFILLS, or have your pharmacy send [place] the request. You can also use MyChart to communicate with me or any office staff.  Please try these tips to maintain a healthy lifestyle:  Eat most of your calories during the day when you are active. Eliminate processed foods including packaged sweets (pies, cakes, cookies), reduce intake of potatoes, white bread, white pasta, and white rice. Look for whole grain options, oat flour or almond flour.  Each meal should contain half fruits/vegetables, one quarter protein, and one quarter carbs (no bigger than a computer mouse).  Cut down on sweet beverages. This includes juice, soda, and sweet tea. Also watch fruit intake, though this is a healthier sweet option, it still contains natural sugar! Limit to 3 servings daily.  Drink at least 1 glass of water with each meal and aim for at least 8 glasses per day  Exercise at least 150 minutes every week.

## 2021-04-18 NOTE — Assessment & Plan Note (Signed)
advised pt nicotine directly associated with PAD, and importance of cessation, willing to try generic Chantix. Advised on use & SE, f/u in 1 mos.

## 2021-04-18 NOTE — Progress Notes (Signed)
New Patient Office Visit  Subjective:  Patient ID: Walter Odom, male    DOB: [birth year]  Age: 61 y.o. MRN: [id number]  CC:  Chief Complaint  Patient presents with   Establish Care   Referral    Vascular; Angioplasty. Pt says that last attempt was unsuccessful, because his vein was too small.    Back Pain    Lower back; Pain with twisting for about 2 weeks now. He has not taken anything for pain.    Hypertension   Erectile Dysfunction    HPI [name] presents for establishing care and to discuss a few problems.  Hypertension: Patient is currently maintained on the following medications for blood pressure: none - used to take Amlodipine & Olmesartan/HCTZ, stopped due to personal preference. Patient reports good compliance with blood pressure medications. Patient denies chest pain, headaches, shortness of breath or swelling. Last 3 blood pressure readings in our office are as follows: BP Readings from Last 3 Encounters:  04/18/21 (!) 158/96  01/08/17 111/75  12/20/16 140/90  Pain He reports recurrent lumbar pain. There was not an injury that may have caused the pain. The pain started  years ago  and is gradually worsening. The pain does not radiate. The pain is described as aching, sharp, and stabbing, is moderate in intensity, occurring intermittently. Symptoms are worse in the: evening, nighttime  Aggravating factors: heavy lifting  He has tried acetaminophen with little relief.  PAD:  reports having recurrent left leg pain with numbness & tingling in left foot; states he was seeing a vascular surgeon via [facility] in 2018; angioplasty procedure performed, but they were unable to get small enough balloon inserted, pain in left leg has worsened, he is requesting referral again. Reports daily use of cigarettes.  Erectile Dysfunction: Patient complains of erectile dysfunction.  Onset of dysfunction was  months ago and was gradual in onset.  Patient states the nature of difficulty is  maintaining erection. Full erections occur with intercourse. Partial erections occur with intercourse. Libido is not affected. Risk factors for ED include cardiovascular disease, aging, and smoking. Patient denies history of diabetes mellitus, neurologic disease, urologic disease, and hypogonadism. Patient's expectations as to sexual function are to maintain an erection past 5 minutes.  Patient's description of relationship w/partner strained.  Previous treatment of ED includes nothing.   Nicotine dependence: pt reports smoking for more than 20 years, currently 1.5ppd. Has been able to quit on his own in the past, but only lasts a few months. Denies other forms of tobacco or nicotine use. Reports he would like to quit.    Past Medical History:  Diagnosis Date   Hypertension     Past Surgical History:  Procedure Laterality Date   ABDOMINAL AORTOGRAM W/LOWER EXTREMITY N/A 01/08/2017   Procedure: ABDOMINAL AORTOGRAM W/LOWER EXTREMITY;  Surgeon: Chuck Hint, MD;  Location: [facility] INVASIVE CV LAB;  Service: Cardiovascular;  Laterality: N/A;   PERIPHERAL VASCULAR BALLOON ANGIOPLASTY Left 01/08/2017   Procedure: PERIPHERAL VASCULAR BALLOON ANGIOPLASTY UNSUCCESSFUL;  Surgeon: Chuck Hint, MD;  Location: [facility] INVASIVE CV LAB;  Service: Cardiovascular;  Laterality: Left;    History reviewed. No pertinent family history.  Social History   Socioeconomic History   Marital status: Married    Spouse name: Not on file   Number of children: Not on file   Years of education: Not on file   Highest education level: Not on file  Occupational History    Comment: [employer]  Tobacco Use  Smoking status: Every Day    Packs/day: 1.50    Types: Cigarettes   Smokeless tobacco: Never  Vaping Use   Vaping Use: Never used  Substance and Sexual Activity   Alcohol use: Yes   Drug use: Not on file   Sexual activity: Not on file  Other Topics Concern   Not on file  Social History  Narrative   pt reports working for [employer] for over 30 years   Social Determinants of Health   Financial Resource Strain: Not on file  Food Insecurity: Not on file  Transportation Needs: Not on file  Physical Activity: Not on file  Stress: Not on file  Social Connections: Not on file  Intimate Partner Violence: Not on file    Objective:   Today's Vitals: BP (!) 158/96    Pulse 70    Temp 98.2 F (36.8 C) (Temporal)    Ht 6' (1.829 m)    Wt 155 lb 6.4 oz (70.5 kg)    SpO2 99%    BMI 21.08 kg/m   Physical Exam Vitals and nursing note reviewed.  Constitutional:      General: He is not in acute distress.    Appearance: Normal appearance.  HENT:     Head: Normocephalic.  Cardiovascular:     Rate and Rhythm: Normal rate and regular rhythm.     Pulses: Decreased pulses.          Posterior tibial pulses are 1+ on the right side and 1+ on the left side.  Pulmonary:     Effort: Pulmonary effort is normal.     Breath sounds: Normal breath sounds.  Musculoskeletal:        General: Normal range of motion.     Cervical back: Normal range of motion.     Right lower leg: No edema.     Left lower leg: No edema.  Skin:    General: Skin is warm and dry.  Neurological:     Mental Status: He is alert and oriented to person, place, and time.     Sensory: Sensation is intact.     Motor: Motor function is intact.     Coordination: Coordination is intact.     Gait: Gait is intact.  Psychiatric:        Mood and Affect: Mood normal.    Assessment & Plan:   Problem List Items Addressed This Visit       Cardiovascular and Mediastinum   Essential hypertension - Primary    Chronic - Unstable- no tx for last 2 years. Denies sx, restarting Amlodipine 10mg  qd. Advised pt on use & SE, drink plenty of water, low sodium diet. f/u in 1 mos.      Relevant Medications   amLODipine (NORVASC) 10 MG tablet   tadalafil (CIALIS) 5 MG tablet   PAD (peripheral artery disease) (HCC)    reports  seeing vascular in 2018 for angioplasty but they were unable to get small enough balloon inserted, pain in left leg has worsened, he is requesting referral.      Relevant Medications   amLODipine (NORVASC) 10 MG tablet   tadalafil (CIALIS) 5 MG tablet   Other Relevant Orders   Ambulatory referral to Vascular Surgery     Other   Right lumbar pain    pt lifts heavy furniture all day, reports having pain off & on for years, was on left side, now right side, worsened 2 weeks ago, has not tried OTC meds,  sending Robaxin & Mobic, advised on use & SE, f/u in 1 mo.      Relevant Medications   methocarbamol (ROBAXIN) 500 MG tablet   Erectile dysfunction    starting low dose Cialis, advised on smoking cessation, return in 1 mo for lab work.      Relevant Medications   tadalafil (CIALIS) 5 MG tablet   Cigarette nicotine dependence without complication    advised pt nicotine directly associated with PAD, and importance of cessation, willing to try generic Chantix. Advised on use & SE, f/u in 1 mos.      Relevant Medications   varenicline (CHANTIX) 0.5 MG tablet    Outpatient Encounter Medications as of 04/18/2021  Medication Sig   amLODipine (NORVASC) 10 MG tablet Take 1 tablet (10 mg total) by mouth daily.   methocarbamol (ROBAXIN) 500 MG tablet Take 1-2 tablets (500-1,000 mg total) by mouth 3 (three) times daily as needed for muscle spasms.   tadalafil (CIALIS) 5 MG tablet Take 1-2 tablets (5-10 mg total) by mouth daily. Daily, or take - 1 hour prior to sexual intercourse, max daily dose 10mg    varenicline (CHANTIX) 0.5 MG tablet Take 1 tablet (0.5 mg total) by mouth 2 (two) times daily. START with 1 pill daily AFTER eating, then increase to twice a day if no nausea   olmesartan-hydrochlorothiazide (BENICAR HCT) 40-12.5 MG tablet Take 1 tablet by mouth daily. (Patient not taking: Reported on 04/18/2021)   [DISCONTINUED] amLODipine (NORVASC) 5 MG tablet Take 5 mg by mouth at bedtime.  (Patient not taking: Reported on 04/18/2021)   [DISCONTINUED] telmisartan-hydrochlorothiazide (MICARDIS HCT) 40-12.5 MG tablet Take 1 tablet by mouth daily. (Patient not taking: Reported on 04/18/2021)   No facility-administered encounter medications on file as of 04/18/2021.    Follow-up: Return in about 4 weeks (around 05/16/2021) for with fasting labs -HTN, smoking medication.   Dulce Sellar, NP

## 2021-04-19 ENCOUNTER — Other Ambulatory Visit (HOSPITAL_COMMUNITY): Payer: Self-pay

## 2021-05-16 ENCOUNTER — Ambulatory Visit: Payer: 59 | Admitting: Family

## 2021-05-23 ENCOUNTER — Ambulatory Visit: Payer: 59 | Admitting: Family

## 2021-05-23 ENCOUNTER — Encounter: Payer: Self-pay | Admitting: Family

## 2021-05-23 VITALS — BP 129/81 | HR 69 | Temp 98.1°F | Ht 72.0 in | Wt 163.6 lb

## 2021-05-23 DIAGNOSIS — I10 Essential (primary) hypertension: Secondary | ICD-10-CM

## 2021-05-23 DIAGNOSIS — F17210 Nicotine dependence, cigarettes, uncomplicated: Secondary | ICD-10-CM

## 2021-05-23 DIAGNOSIS — Z23 Encounter for immunization: Secondary | ICD-10-CM

## 2021-05-23 DIAGNOSIS — N529 Male erectile dysfunction, unspecified: Secondary | ICD-10-CM | POA: Diagnosis not present

## 2021-05-23 DIAGNOSIS — L729 Follicular cyst of the skin and subcutaneous tissue, unspecified: Secondary | ICD-10-CM | POA: Diagnosis not present

## 2021-05-23 LAB — COMPREHENSIVE METABOLIC PANEL
ALT: 25 U/L (ref 0–53)
AST: 25 U/L (ref 0–37)
Albumin: 4.6 g/dL (ref 3.5–5.2)
Alkaline Phosphatase: 72 U/L (ref 39–117)
BUN: 16 mg/dL (ref 6–23)
CO2: 25 mEq/L (ref 19–32)
Calcium: 9.3 mg/dL (ref 8.4–10.5)
Chloride: 105 mEq/L (ref 96–112)
Creatinine, Ser: 0.94 mg/dL (ref 0.40–1.50)
GFR: 87.92 mL/min (ref >60.00)
Glucose, Bld: 84 mg/dL (ref 70–99)
Potassium: 4.5 mEq/L (ref 3.5–5.1)
Sodium: 138 mEq/L (ref 135–145)
Total Bilirubin: 0.3 mg/dL (ref 0.2–1.2)
Total Protein: 6.8 g/dL (ref 6.0–8.3)

## 2021-05-23 LAB — LIPID PANEL
Cholesterol: 218 mg/dL — ABNORMAL HIGH (ref 0–200)
HDL: 53.6 mg/dL (ref >39.00)
LDL Cholesterol: 144 mg/dL — ABNORMAL HIGH (ref 0–99)
NonHDL: 164.03
Total CHOL/HDL Ratio: 4
Triglycerides: 101 mg/dL (ref 0.0–149.0)
VLDL: 20.2 mg/dL (ref 0.0–40.0)

## 2021-05-23 NOTE — Assessment & Plan Note (Addendum)
Chronic - better today with Amlodipine qd, will send refill. ?

## 2021-05-23 NOTE — Progress Notes (Signed)
? ?Subjective:  ? ? ? Patient ID: Walter Odom, male    DOB: 04-29-1960, 61 y.o.   MRN: [id number] ? ?Chief Complaint  ?Patient presents with  ? Hypertension  ? Cyst  ?  Pt c/o cyst on the back of neck, He states it is not painful.   ? ? ?HPI: ?Erectile Dysfunction: Patient complains of erectile dysfunction.  Onset of dysfunction was  months ago and was gradual in onset.  Patient states the nature of difficulty is maintaining erection. Full erections occur with intercourse. Partial erections occur with intercourse. Libido is not affected. Risk factors for ED include cardiovascular disease, aging, and smoking. Patient denies history of diabetes mellitus, neurologic disease, urologic disease, and hypogonadism. Patient's expectations as to sexual function are to maintain an erection past 5 minutes.  Patient's description of relationship w/partner is strained.  Pt reports taking Cialis daily, but is not working. ?Nicotine dependence: pt reports smoking for more than 20 years, currently 1.5ppd. Has been able to quit on his own in the past, but only lasts a few months. Denies other forms of tobacco or nicotine use. Reports he would like to quit. Started Chantix, 1 pill daily, down to 6 cigs/day. ?Hypertension: Patient is currently maintained on the following medications for blood pressure: Amlodipine & Olmesartan/HCTZ, tolerating. ?Patient reports good compliance with blood pressure medications. ?Patient denies chest pain, headaches, shortness of breath or swelling. ?Last 3 blood pressure readings in our office are as follows: ?BP Readings from Last 3 Encounters:  ?05/23/21 129/81  ?04/18/21 (!) 158/96  ?01/08/17 111/75  ? ? ?Health Maintenance Due  ?Topic Date Due  ? HIV Screening  Never done  ? Hepatitis C Screening  Never done  ? Zoster Vaccines- Shingrix (1 of 2) Never done  ? COVID-19 Vaccine (3 - Booster for Pfizer series) 08/11/2019  ? ? ?Past Medical History:  ?Diagnosis Date  ? Hypertension   ? ? ?Past Surgical  History:  ?Procedure Laterality Date  ? ABDOMINAL AORTOGRAM W/LOWER EXTREMITY N/A 01/08/2017  ? Procedure: ABDOMINAL AORTOGRAM W/LOWER EXTREMITY;  Surgeon: Angelia Mould, MD;  Location: [facility];  Service: Cardiovascular;  Laterality: N/A;  ? PERIPHERAL VASCULAR BALLOON ANGIOPLASTY Left 01/08/2017  ? Procedure: PERIPHERAL VASCULAR BALLOON ANGIOPLASTY UNSUCCESSFUL;  Surgeon: Angelia Mould, MD;  Location: [facility];  Service: Cardiovascular;  Laterality: Left;  ? ? ?Outpatient Medications Prior to Visit  ?Medication Sig Dispense Refill  ? amLODipine (NORVASC) 10 MG tablet Take 1 tablet (10 mg total) by mouth daily. 90 tablet 1  ? meloxicam (MOBIC) 7.5 MG tablet Take 1 tablet (7.5 mg total) by mouth daily. 30 tablet 0  ? methocarbamol (ROBAXIN) 500 MG tablet Take 1-2 tablets (500-1,000 mg total) by mouth 3 (three) times daily as needed for muscle spasms. 60 tablet 0  ? olmesartan-hydrochlorothiazide (BENICAR HCT) 40-12.5 MG tablet Take 1 tablet by mouth daily.    ? tadalafil (CIALIS) 5 MG tablet Take 1-2 tablets (5-10 mg total) by mouth daily. Daily, or take 93min- 1 hour prior to sexual intercourse, max daily dose $RemoveBe'10mg'[id number]$  30 tablet 0  ? varenicline (CHANTIX) 0.5 MG tablet Take 1 tablet (0.5 mg total) by mouth 2 (two) times daily. START with 1 pill daily AFTER eating, then increase to twice a day if no nausea 60 tablet 5  ? ?No facility-administered medications prior to visit.  ? ? ?No Known Allergies ? ? ?   ?Objective:  ?  ?Physical Exam ?Vitals and nursing note reviewed.  ?  Constitutional:   ?   General: He is not in acute distress. ?   Appearance: Normal appearance.  ?HENT:  ?   Head: Normocephalic.  ?Cardiovascular:  ?   Rate and Rhythm: Normal rate and regular rhythm.  ?Pulmonary:  ?   Effort: Pulmonary effort is normal.  ?   Breath sounds: Normal breath sounds.  ?Musculoskeletal:     ?   General: Normal range of motion.  ?   Cervical back: Normal range of motion.  ?Skin: ?    General: Skin is warm and dry.  ?   Findings: Lesion present. Injury: cyst on back of neck, no erythema, non-tender. ?Neurological:  ?   Mental Status: He is alert and oriented to person, place, and time.  ?Psychiatric:     ?   Mood and Affect: Mood normal.  ? ? ?BP 129/81 (BP Location: Left Arm, Patient Position: Sitting, Cuff Size: Large)   Pulse 69   Temp 98.1 ?F (36.7 ?C) (Temporal)   Ht 6' (1.829 m)   Wt 163 lb 9.6 oz (74.2 kg)   SpO2 99%   BMI 22.19 kg/m?  ?Wt Readings from Last 3 Encounters:  ?05/23/21 163 lb 9.6 oz (74.2 kg)  ?04/18/21 155 lb 6.4 oz (70.5 kg)  ?01/08/17 157 lb (71.2 kg)  ? ? ?   ?Assessment & Plan:  ? ?Problem List Items Addressed This Visit   ? ?  ? Cardiovascular and Mediastinum  ? Essential hypertension  ?  Chronic - better today with Amlodipine qd, will send refill. ?  ?  ? Relevant Orders  ? Lipid panel  ? Comp Met (CMET)  ?  ? Other  ? Erectile dysfunction  ?  pt reports Cialis not working, advised to increase to extra pill prior to sexual intercourse. Has reduced smoking, advised again on complete cessation. ?  ?  ? Relevant Orders  ? Testosterone, Free, Total, SHBG  ? Cigarette nicotine dependence without complication  ?  down to 6 cigs/day, taking 1 chantix qd, advised to increase to 1 pill bid until no longer smoking and continue for at least 2 mos on medication. ?  ?  ? ?Other Visit Diagnoses   ? ? Need for prophylactic vaccination with combined diphtheria-tetanus-pertussis (DTP) vaccine    -  Primary  ? Relevant Orders  ? Tdap vaccine greater than or equal to 7yo IM (Completed)  ? Benign skin cyst      ? Relevant Orders  ? Ambulatory referral to Dermatology  ? ?  ? ? ?Jeanie Sewer, NP ? ?

## 2021-05-23 NOTE — Assessment & Plan Note (Signed)
pt reports Cialis not working, advised to increase to extra pill prior to sexual intercourse. Has reduced smoking, advised again on complete cessation. ?

## 2021-05-23 NOTE — Patient Instructions (Addendum)
It was very nice to see you today! ? ?Go to the lab for blood work today. ?Increase the generic Chantix to 1 pill twice a day to help you completely stop smoking. ?Continue taking the Amlodipine daily for your blood pressure, it looks much better today. ?I will send a referral to Dermatology for your cyst, they are backed up a few months, so be patient, they should call at least to schedule in the next 2 weeks. ?Continue taking the Cialis for ED every day, but OK to take an extra pill prior to sexual activity. ?All refills have been sent. ? ? ? ? ?PLEASE NOTE: ? ?If you had any lab tests please let us know if you have not heard back within a few days. You may see your results on MyChart before we have a chance to review them but we will give you a call once they are reviewed by [place]. If we ordered any referrals today, please let us know if you have not heard from their office within the next week.  ? ? ? ?

## 2021-05-23 NOTE — Assessment & Plan Note (Signed)
down to 6 cigs/day, taking 1 chantix qd, advised to increase to 1 pill bid until no longer smoking and continue for at least 2 mos on medication. ?

## 2021-05-27 ENCOUNTER — Other Ambulatory Visit: Payer: Self-pay | Admitting: Family

## 2021-05-27 ENCOUNTER — Other Ambulatory Visit (HOSPITAL_COMMUNITY): Payer: Self-pay

## 2021-05-27 DIAGNOSIS — M545 Low back pain, unspecified: Secondary | ICD-10-CM

## 2021-05-27 MED ORDER — MELOXICAM 7.5 MG PO TABS
7.5000 mg | ORAL_TABLET | Freq: Every day | ORAL | 1 refills | Status: DC
  Filled 2021-05-27: qty 90, 90d supply, fill #0

## 2021-05-27 NOTE — Telephone Encounter (Signed)
ok to refill 90 pills with 1 refill, thx

## 2021-05-27 NOTE — Progress Notes (Signed)
Glucose, electrolytes, liver & kidney function all normal. ? ?Total cholesterol number & LDL (bad #) are high.  Because you have a high risk score of 22.4% of having a cardiac event due to your other risk factors including high cholesterol, I recommend you start a low dose statin medication.  ?But you also need to reduce any fried foods, alcohol, nonnutritional snacks e.g. chips/cookies,pies, cakes and candies, fatty meat (red meat), high fat dairy foods:  including cheese, milk, ice cream.  ? ?Recommend repeating fasting lipids (no food or drink except black coffee or water after midnight) in 6 months with an office visit, can schedule this today. ? ?Let me know If you are willing to start the medication, and if you have any questions! ?

## 2021-05-30 ENCOUNTER — Encounter: Payer: Self-pay | Admitting: Family

## 2021-05-30 DIAGNOSIS — E782 Mixed hyperlipidemia: Secondary | ICD-10-CM

## 2021-06-03 ENCOUNTER — Other Ambulatory Visit: Payer: Self-pay

## 2021-06-03 ENCOUNTER — Telehealth: Payer: Self-pay | Admitting: Family

## 2021-06-03 ENCOUNTER — Other Ambulatory Visit (HOSPITAL_COMMUNITY): Payer: Self-pay

## 2021-06-03 DIAGNOSIS — N529 Male erectile dysfunction, unspecified: Secondary | ICD-10-CM

## 2021-06-03 MED ORDER — TADALAFIL 5 MG PO TABS
5.0000 mg | ORAL_TABLET | Freq: Every day | ORAL | 0 refills | Status: DC
  Filled 2021-06-03: qty 23, 12d supply, fill #0
  Filled 2021-06-03: qty 7, 3d supply, fill #0

## 2021-06-03 NOTE — Telephone Encounter (Signed)
Pt is asking for a call back. He has questions regarding his meds ?

## 2021-06-03 NOTE — Telephone Encounter (Signed)
I called and spoke with pt in regards to his medication(Cialis). Medication was sent over to his pharmacy. ?

## 2021-06-05 LAB — SPECIMEN STATUS REPORT

## 2021-06-05 NOTE — Progress Notes (Signed)
[name], I'm trying to find the message where I thought Walter Odom wanted to start a statin med for his cholesterol in addition to the Cialis? Do you see this anywhere? I may be confusing him with another pt.Marland KitchenMarland KitchenMarland Kitchen

## 2021-06-07 MED ORDER — ATORVASTATIN CALCIUM 10 MG PO TABS
10.0000 mg | ORAL_TABLET | Freq: Every day | ORAL | 0 refills | Status: DC
  Filled 2021-06-07: qty 90, 90d supply, fill #0

## 2021-06-08 ENCOUNTER — Other Ambulatory Visit (HOSPITAL_COMMUNITY): Payer: Self-pay

## 2021-06-15 LAB — TESTOSTERONE, FREE, TOTAL, SHBG
Sex Hormone Binding: 46.4 nmol/L (ref 19.3–76.4)
Testosterone, Free: 8.3 pg/mL (ref 6.6–18.1)

## 2021-06-16 ENCOUNTER — Other Ambulatory Visit (HOSPITAL_COMMUNITY): Payer: Self-pay

## 2021-06-27 ENCOUNTER — Ambulatory Visit (INDEPENDENT_AMBULATORY_CARE_PROVIDER_SITE_OTHER): Payer: 59 | Admitting: Emergency Medicine

## 2021-06-27 ENCOUNTER — Encounter: Payer: Self-pay | Admitting: Emergency Medicine

## 2021-06-27 ENCOUNTER — Other Ambulatory Visit (HOSPITAL_COMMUNITY): Payer: Self-pay

## 2021-06-27 VITALS — BP 112/72 | HR 98 | Temp 98.6°F | Ht 72.0 in | Wt 151.5 lb

## 2021-06-27 DIAGNOSIS — F17210 Nicotine dependence, cigarettes, uncomplicated: Secondary | ICD-10-CM | POA: Diagnosis not present

## 2021-06-27 DIAGNOSIS — I10 Essential (primary) hypertension: Secondary | ICD-10-CM

## 2021-06-27 DIAGNOSIS — Z7689 Persons encountering health services in other specified circumstances: Secondary | ICD-10-CM

## 2021-06-27 DIAGNOSIS — L723 Sebaceous cyst: Secondary | ICD-10-CM

## 2021-06-27 DIAGNOSIS — N529 Male erectile dysfunction, unspecified: Secondary | ICD-10-CM

## 2021-06-27 DIAGNOSIS — G8929 Other chronic pain: Secondary | ICD-10-CM

## 2021-06-27 DIAGNOSIS — M25562 Pain in left knee: Secondary | ICD-10-CM

## 2021-06-27 DIAGNOSIS — M25561 Pain in right knee: Secondary | ICD-10-CM

## 2021-06-27 MED ORDER — SILDENAFIL CITRATE 100 MG PO TABS
50.0000 mg | ORAL_TABLET | Freq: Every day | ORAL | 11 refills | Status: DC | PRN
  Filled 2021-06-27: qty 5, 25d supply, fill #0
  Filled 2021-07-27: qty 5, 25d supply, fill #1
  Filled 2021-08-12: qty 5, 25d supply, fill #2
  Filled 2021-09-22: qty 5, 25d supply, fill #3
  Filled 2021-10-25: qty 5, 25d supply, fill #4
  Filled 2021-11-25: qty 5, 25d supply, fill #5
  Filled 2022-01-03: qty 5, 25d supply, fill #6
  Filled 2022-02-14: qty 5, 25d supply, fill #7

## 2021-06-27 NOTE — Assessment & Plan Note (Signed)
Cialis not working.  We will try Viagra instead. ?

## 2021-06-27 NOTE — Progress Notes (Signed)
Walter Odom ?61 y.o. ? ? ?Chief Complaint  ?Patient presents with  ? TRANSFER OF cARE  ? Cyst  ?  On back of neck   ? Joint Swelling  ?  Swelling in both knees  ? ? ?HISTORY OF PRESENT ILLNESS: ?This is a 61 y.o. male first visit to this office, here to establish care with me. ?History of hypertension on olmesartan and amlodipine ?History of erectile dysfunction but Cialis not working.  Wants to try Viagra. ?Occasionally gets bilateral knee swelling and pain ?Also high sebaceous cyst to back of the neck.  Needs dermatology referral for removal. ?Smoker but using Chantix and smoking less ?No other complaints or medical concerns today. ? ?HPI ? ? ?Prior to Admission medications   ?Medication Sig Start Date End Date Taking? Authorizing Provider  ?amLODipine (NORVASC) 10 MG tablet Take 1 tablet (10 mg total) by mouth daily. 04/18/21  Yes Dulce Sellar, NP  ?atorvastatin (LIPITOR) 10 MG tablet Take 1 tablet (10 mg total) by mouth daily. Start with 1 pill every other day for 2 weeks, then take 1 pill daily. 06/07/21  Yes Dulce Sellar, NP  ?meloxicam (MOBIC) 7.5 MG tablet Take 1 tablet (7.5 mg total) by mouth daily. 05/27/21  Yes Dulce Sellar, NP  ?methocarbamol (ROBAXIN) 500 MG tablet Take 1-2 tablets (500-1,000 mg total) by mouth 3 (three) times daily as needed for muscle spasms. 04/18/21  Yes Dulce Sellar, NP  ?olmesartan-hydrochlorothiazide (BENICAR HCT) 40-12.5 MG tablet Take 1 tablet by mouth daily.   Yes [provider]  ?sildenafil (VIAGRA) 100 MG tablet Take 0.5-1 tablets (50-100 mg total) by mouth daily as needed for erectile dysfunction. 06/27/21  Yes Shacora Zynda, Eilleen Kempf, MD  ?tadalafil (CIALIS) 5 MG tablet Take 1-2 tablets (5-10 mg total) by mouth daily or take 30 minutes - 1 hour prior to sexual intercourse, max daily dose 10mg  06/03/21  Yes Hudnell, 06/05/21, NP  ?varenicline (CHANTIX) 0.5 MG tablet Take 1 tablet (0.5 mg total) by mouth 2 (two) times daily. START with 1 pill daily  AFTER eating, then increase to twice a day if no nausea 04/18/21  Yes 06/16/21, NP  ? ? ?No Known Allergies ? ?Patient Active Problem List  ? Diagnosis Date Noted  ? Essential hypertension 04/18/2021  ? PAD (peripheral artery disease) (HCC) 04/18/2021  ? Erectile dysfunction 04/18/2021  ? Cigarette nicotine dependence without complication 04/18/2021  ? ? ?Past Medical History:  ?Diagnosis Date  ? Hypertension   ? ? ?Past Surgical History:  ?Procedure Laterality Date  ? ABDOMINAL AORTOGRAM W/LOWER EXTREMITY N/A 01/08/2017  ? Procedure: ABDOMINAL AORTOGRAM W/LOWER EXTREMITY;  Surgeon: 01/10/2017, MD;  Location: [facility] INVASIVE CV LAB;  Service: Cardiovascular;  Laterality: N/A;  ? PERIPHERAL VASCULAR BALLOON ANGIOPLASTY Left 01/08/2017  ? Procedure: PERIPHERAL VASCULAR BALLOON ANGIOPLASTY UNSUCCESSFUL;  Surgeon: 01/10/2017, MD;  Location: [facility] INVASIVE CV LAB;  Service: Cardiovascular;  Laterality: Left;  ? ? ?Social History  ? ?Socioeconomic History  ? Marital status: Married  ?  Spouse name: Not on file  ? Number of children: Not on file  ? Years of education: Not on file  ? Highest education level: Not on file  ?Occupational History  ?  Comment: [employer]  ?Tobacco Use  ? Smoking status: Every Day  ?  Packs/day: 1.50  ?  Types: Cigarettes  ? Smokeless tobacco: Never  ?Vaping Use  ? Vaping Use: Never used  ?Substance and Sexual Activity  ? Alcohol use: Yes  ?  Drug use: Not on file  ? Sexual activity: Not on file  ?Other Topics Concern  ? Not on file  ?Social History Narrative  ? pt reports working for [employer] for over 30 years  ? ?Social Determinants of Health  ? ?Financial Resource Strain: Not on file  ?Food Insecurity: Not on file  ?Transportation Needs: Not on file  ?Physical Activity: Not on file  ?Stress: Not on file  ?Social Connections: Not on file  ?Intimate Partner Violence: Not on file  ? ? ?History reviewed. No pertinent family history. ? ? ?Review of Systems   ?Constitutional: Negative.  Negative for chills and fever.  ?HENT: Negative.  Negative for congestion and sore throat.   ?Respiratory: Negative.  Negative for cough and shortness of breath.   ?Cardiovascular: Negative.  Negative for chest pain and palpitations.  ?Gastrointestinal:  Negative for abdominal pain, nausea and vomiting.  ?Genitourinary: Negative.   ?Skin: Negative.  Negative for rash.  ?Neurological:  Negative for dizziness and headaches.  ?All other systems reviewed and are negative. ? ?Today's Vitals  ? 06/27/21 1449  ?BP: 112/72  ?Pulse: 98  ?Temp: 98.6 ?F (37 ?C)  ?SpO2: 97%  ?Weight: 151 lb 8 oz (68.7 kg)  ?Height: 6' (1.829 m)  ? ?Body mass index is 20.55 kg/m?. ? ?Physical Exam ?Constitutional:   ?   Appearance: Normal appearance.  ?HENT:  ?   Head: Normocephalic.  ?Eyes:  ?   Extraocular Movements: Extraocular movements intact.  ?   Pupils: Pupils are equal, round, and reactive to light.  ?Cardiovascular:  ?   Rate and Rhythm: Normal rate and regular rhythm.  ?   Pulses: Normal pulses.  ?   Heart sounds: Normal heart sounds.  ?Pulmonary:  ?   Effort: Pulmonary effort is normal.  ?   Breath sounds: Normal breath sounds.  ?Musculoskeletal:  ?   Cervical back: No tenderness.  ?   Comments: Knees: No swelling or tenderness.  Full range of motion.  ?Lymphadenopathy:  ?   Cervical: No cervical adenopathy.  ?Skin: ?   General: Skin is warm and dry.  ?   Capillary Refill: Capillary refill takes less than 2 seconds.  ?   Comments: Sebaceous cyst posterior neck.  No erythema.  No fluctuation.  No signs of infection.  ?Neurological:  ?   General: No focal deficit present.  ?   Mental Status: He is alert and oriented to person, place, and time.  ?Psychiatric:     ?   Mood and Affect: Mood normal.     ?   Behavior: Behavior normal.  ? ? ? ?ASSESSMENT & PLAN: ?A total of 48 minutes was spent with the patient and counseling/coordination of care regarding preparing for this visit, establishing care with me,  review of available medical records, review of all medications, review of most recent blood work results, review of multiple chronic medical problems and their treatment, education on nutrition, prognosis, documentation, cardiovascular risks associated with hypertension and smoking, need for follow-up. ? ?Problem List Items Addressed This Visit   ? ?  ? Cardiovascular and Mediastinum  ? Essential hypertension - Primary  ?  Well-controlled.  Normal recent renal function testing. ?Continue olmesartan HCTZ 40-12 0.5 and amlodipine 10 mg daily. ?BP Readings from Last 3 Encounters:  ?06/27/21 112/72  ?05/23/21 129/81  ?04/18/21 (!) 158/96  ?Dietary approaches to stop hypertension discussed. ? ?  ?  ? Relevant Medications  ? sildenafil (VIAGRA) 100 MG tablet  ?  ?  Musculoskeletal and Integument  ? Sebaceous cyst  ?  Posterior neck.  No abscess formation.  No erythema. ?We will refer to dermatology for definitive removal. ? ?  ?  ? Relevant Orders  ? Ambulatory referral to Dermatology  ?  ? Other  ? Erectile dysfunction  ?  Cialis not working.  We will try Viagra instead. ? ?  ?  ? Relevant Medications  ? sildenafil (VIAGRA) 100 MG tablet  ? Cigarette nicotine dependence without complication  ?  Cardiovascular and cancer risks associated with smoking discussed.  Smoking cessation advice given.  Continue Chantix. ? ?  ?  ? Chronic pain of both knees  ?  Uncomplicated.  Advised to take Tylenol and or Advil as needed. ? ?  ?  ? ?Other Visit Diagnoses   ? ? Encounter to establish care      ? ?  ? ?Patient Instructions  ?Health Maintenance, Male ?Adopting a healthy lifestyle and getting preventive care are important in promoting health and wellness. Ask your health care provider about: ?The right schedule for you to have regular tests and exams. ?Things you can do on your own to prevent diseases and keep yourself healthy. ?What should I know about diet, weight, and exercise? ?Eat a healthy diet ? ?Eat a diet that includes  plenty of vegetables, fruits, low-fat dairy products, and lean protein. ?Do not eat a lot of foods that are high in solid fats, added sugars, or sodium. ?Maintain a healthy weight ?Body mass index (BMI) is a measu

## 2021-06-27 NOTE — Assessment & Plan Note (Signed)
Well-controlled.  Normal recent renal function testing. ?Continue olmesartan HCTZ 40-12 0.5 and amlodipine 10 mg daily. ?BP Readings from Last 3 Encounters:  ?06/27/21 112/72  ?05/23/21 129/81  ?04/18/21 (!) 158/96  ?Dietary approaches to stop hypertension discussed. ? ?

## 2021-06-27 NOTE — Assessment & Plan Note (Signed)
Cardiovascular and cancer risks associated with smoking discussed.  Smoking cessation advice given.  Continue Chantix. ?

## 2021-06-27 NOTE — Patient Instructions (Signed)
Health Maintenance, Male Adopting a healthy lifestyle and getting preventive care are important in promoting health and wellness. Ask your health care provider about: The right schedule for you to have regular tests and exams. Things you can do on your own to prevent diseases and keep yourself healthy. What should I know about diet, weight, and exercise? Eat a healthy diet  Eat a diet that includes plenty of vegetables, fruits, low-fat dairy products, and lean protein. Do not eat a lot of foods that are high in solid fats, added sugars, or sodium. Maintain a healthy weight Body mass index (BMI) is a measurement that can be used to identify possible weight problems. It estimates body fat based on height and weight. Your health care provider can help determine your BMI and help you achieve or maintain a healthy weight. Get regular exercise Get regular exercise. This is one of the most important things you can do for your health. Most adults should: Exercise for at least 150 minutes each week. The exercise should increase your heart rate and make you sweat (moderate-intensity exercise). Do strengthening exercises at least twice a week. This is in addition to the moderate-intensity exercise. Spend less time sitting. Even light physical activity can be beneficial. Watch cholesterol and blood lipids Have your blood tested for lipids and cholesterol at 61 years of age, then have this test every 5 years. You may need to have your cholesterol levels checked more often if: Your lipid or cholesterol levels are high. You are older than 61 years of age. You are at high risk for heart disease. What should I know about cancer screening? Many types of cancers can be detected early and may often be prevented. Depending on your health history and family history, you may need to have cancer screening at various ages. This may include screening for: Colorectal cancer. Prostate cancer. Skin cancer. Lung  cancer. What should I know about heart disease, diabetes, and high blood pressure? Blood pressure and heart disease High blood pressure causes heart disease and increases the risk of stroke. This is more likely to develop in people who have high blood pressure readings or are overweight. Talk with your health care provider about your target blood pressure readings. Have your blood pressure checked: Every 3-5 years if you are 18-39 years of age. Every year if you are 40 years old or older. If you are between the ages of 65 and 75 and are a current or former smoker, ask your health care provider if you should have a one-time screening for abdominal aortic aneurysm (AAA). Diabetes Have regular diabetes screenings. This checks your fasting blood sugar level. Have the screening done: Once every three years after age 45 if you are at a normal weight and have a low risk for diabetes. More often and at a younger age if you are overweight or have a high risk for diabetes. What should I know about preventing infection? Hepatitis B If you have a higher risk for hepatitis B, you should be screened for this virus. Talk with your health care provider to find out if you are at risk for hepatitis B infection. Hepatitis C Blood testing is recommended for: Everyone born from 1945 through 1965. Anyone with known risk factors for hepatitis C. Sexually transmitted infections (STIs) You should be screened each year for STIs, including gonorrhea and chlamydia, if: You are sexually active and are younger than 61 years of age. You are older than 61 years of age and your   health care provider tells you that you are at risk for this type of infection. Your sexual activity has changed since you were last screened, and you are at increased risk for chlamydia or gonorrhea. Ask your health care provider if you are at risk. Ask your health care provider about whether you are at high risk for HIV. Your health care provider  may recommend a prescription medicine to help prevent HIV infection. If you choose to take medicine to prevent HIV, you should first get tested for HIV. You should then be tested every 3 months for as long as you are taking the medicine. Follow these instructions at home: Alcohol use Do not drink alcohol if your health care provider tells you not to drink. If you drink alcohol: Limit how much you have to 0-2 drinks a day. Know how much alcohol is in your drink. In the [place] one drink equals one 12 oz bottle of beer (355 mL), one 5 oz glass of wine (148 mL), or one 1 oz glass of hard liquor (44 mL). Lifestyle Do not use any products that contain nicotine or tobacco. These products include cigarettes, chewing tobacco, and vaping devices, such as e-cigarettes. If you need help quitting, ask your health care provider. Do not use street drugs. Do not share needles. Ask your health care provider for help if you need support or information about quitting drugs. General instructions Schedule regular health, dental, and eye exams. Stay current with your vaccines. Tell your health care provider if: You often feel depressed. You have ever been abused or do not feel safe at home. Summary Adopting a healthy lifestyle and getting preventive care are important in promoting health and wellness. Follow your health care provider's instructions about healthy diet, exercising, and getting tested or screened for diseases. Follow your health care provider's instructions on monitoring your cholesterol and blood pressure. This information is not intended to replace advice given to you by your health care provider. Make sure you discuss any questions you have with your health care provider. Document Revised: 07/19/2020 Document Reviewed: 07/19/2020 Elsevier Patient Education  2023 Elsevier Inc.  

## 2021-06-27 NOTE — Assessment & Plan Note (Signed)
Uncomplicated.  Advised to take Tylenol and or Advil as needed. ?

## 2021-06-27 NOTE — Assessment & Plan Note (Signed)
Posterior neck.  No abscess formation.  No erythema. ?We will refer to dermatology for definitive removal. ?

## 2021-07-20 ENCOUNTER — Other Ambulatory Visit (HOSPITAL_COMMUNITY): Payer: Self-pay

## 2021-07-27 ENCOUNTER — Other Ambulatory Visit (HOSPITAL_COMMUNITY): Payer: Self-pay

## 2021-08-12 ENCOUNTER — Other Ambulatory Visit (HOSPITAL_COMMUNITY): Payer: Self-pay

## 2021-08-16 ENCOUNTER — Other Ambulatory Visit (HOSPITAL_COMMUNITY): Payer: Self-pay

## 2021-08-23 ENCOUNTER — Other Ambulatory Visit: Payer: Self-pay | Admitting: *Deleted

## 2021-08-23 DIAGNOSIS — I739 Peripheral vascular disease, unspecified: Secondary | ICD-10-CM

## 2021-09-01 ENCOUNTER — Ambulatory Visit: Payer: 59 | Admitting: Vascular Surgery

## 2021-09-01 ENCOUNTER — Inpatient Hospital Stay (HOSPITAL_COMMUNITY): Admission: RE | Admit: 2021-09-01 | Payer: No Typology Code available for payment source | Source: Ambulatory Visit

## 2021-09-01 ENCOUNTER — Ambulatory Visit (HOSPITAL_COMMUNITY)
Admission: RE | Admit: 2021-09-01 | Discharge: 2021-09-01 | Disposition: A | Discharge status: Home / Self Care | Payer: 59 | Source: Ambulatory Visit | Attending: Vascular Surgery | Admitting: Vascular Surgery

## 2021-09-01 ENCOUNTER — Encounter: Payer: Self-pay | Admitting: Vascular Surgery

## 2021-09-01 VITALS — BP 121/79 | HR 62 | Temp 98.0°F | Resp 20 | Ht 72.0 in | Wt 153.0 lb

## 2021-09-01 DIAGNOSIS — I739 Peripheral vascular disease, unspecified: Secondary | ICD-10-CM

## 2021-09-01 NOTE — Progress Notes (Addendum)
ASSESSMENT & PLAN   PERIPHERAL ARTERIAL DISEASE: This patient does have evidence of infrainguinal arterial occlusive disease bilaterally.  However I do not think his symptoms in the right knee can be attributed to this.  He states that his right knee gives out when he is working.  I do not get any clear-cut history of calf claudication or rest pain.  He has no history of nonhealing wounds.  He does continue to smoke a half a pack per day and we have again discussed the importance of tobacco cessation.  I think he would benefit from being on aspirin and a low-dose statin.  I have encouraged him to stay as active as possible.  I have offered to see him on a yearly basis to follow his peripheral arterial disease.  He would prefer to be seen as needed.  NIGHT CRAMPS: He does complain of some night cramps at night.  If this persist he will follow-up with his primary care physician to rule out any electrolyte abnormalities.  I encouraged him to stretch and stay well-hydrated.  We have also discussed some potential remedies such as tonic water which has quinine, coconut water, mustard, or pickle juice.  REASON FOR CONSULT:    Peripheral arterial disease.  The consult is requested by Landry Mellow, PA  HPI:   Walter Odom is a 61 y.o. male who presents for evaluation of peripheral arterial disease.  I last saw him back in October 2018.  He had presented with left lower extremity claudication.  We discussed conservative treatment however he felt that his symptoms were significantly bothersome and therefore we pursued arteriography.  He underwent arteriography.  He had occlusion of the superficial femoral artery on the right with reconstitution of the above-knee popliteal artery.  I was unable to get through the occlusion.  We discussed conservative treatment.  He was then lost to follow-up.  Since I saw him last, his main complaint is that his right knee gives out on him when he is working.  I do not get  any clear-cut history of calf claudication, rest pain, or nonhealing ulcers.  He is very active at work Environmental manager.  He does continue to smoke a half a pack per day.  He also complains of some night cramps in his lower legs at night.  Past Medical History:  Diagnosis Date   Hypertension     History reviewed. No pertinent family history.  SOCIAL HISTORY: Social History   Tobacco Use   Smoking status: Every Day    Packs/day: 0.50    Types: Cigarettes   Smokeless tobacco: Never  Substance Use Topics   Alcohol use: Yes    No Known Allergies  Current Outpatient Medications  Medication Sig Dispense Refill   amLODipine (NORVASC) 10 MG tablet Take 1 tablet (10 mg total) by mouth daily. 90 tablet 1   atorvastatin (LIPITOR) 10 MG tablet Take 1 tablet (10 mg total) by mouth daily. Start with 1 pill every other day for 2 weeks, then take 1 pill daily. 90 tablet 0   meloxicam (MOBIC) 7.5 MG tablet Take 1 tablet (7.5 mg total) by mouth daily. 90 tablet 1   methocarbamol (ROBAXIN) 500 MG tablet Take 1-2 tablets (500-1,000 mg total) by mouth 3 (three) times daily as needed for muscle spasms. 60 tablet 0   olmesartan-hydrochlorothiazide (BENICAR HCT) 40-12.5 MG tablet Take 1 tablet by mouth daily.     sildenafil (VIAGRA) 100 MG tablet Take 0.5-1 tablets (50-100 mg total)  by mouth daily as needed for erectile dysfunction. 5 tablet 11   tadalafil (CIALIS) 5 MG tablet Take 1-2 tablets (5-10 mg total) by mouth daily or take 30 minutes - 1 hour prior to sexual intercourse, max daily dose 10mg  30 tablet 0   varenicline (CHANTIX) 0.5 MG tablet Take 1 tablet (0.5 mg total) by mouth 2 (two) times daily. START with 1 pill daily AFTER eating, then increase to twice a day if no nausea 60 tablet 5   No current facility-administered medications for this visit.    REVIEW OF SYSTEMS:  [X]  denotes positive finding, [ ]  denotes negative finding Cardiac  Comments:  Chest pain or chest pressure:     Shortness of breath upon exertion:    Short of breath when lying flat:    Irregular heart rhythm:        Vascular    Pain in calf, thigh, or hip brought on by ambulation:    Pain in feet at night that wakes you up from your sleep:     Blood clot in your veins:    Leg swelling:         Pulmonary    Oxygen at home:    Productive cough:     Wheezing:         Neurologic    Sudden weakness in arms or legs:     Sudden numbness in arms or legs:     Sudden onset of difficulty speaking or slurred speech:    Temporary loss of vision in one eye:     Problems with dizziness:         Gastrointestinal    Blood in stool:     Vomited blood:         Genitourinary    Burning when urinating:     Blood in urine:        Psychiatric    Major depression:         Hematologic    Bleeding problems:    Problems with blood clotting too easily:        Skin    Rashes or ulcers:        Constitutional    Fever or chills:    -  PHYSICAL EXAM:   Vitals:   09/01/21 1450  BP: 121/79  Pulse: 62  Resp: 20  Temp: 98 F (36.7 C)  SpO2: 95%  Weight: 153 lb (69.4 kg)  Height: 6' (1.829 m)   Body mass index is 20.75 kg/m. GENERAL: The patient is a well-nourished male, in no acute distress. The vital signs are documented above. CARDIAC: There is a regular rate and rhythm.  VASCULAR: I do not detect carotid bruits. He has palpable femoral pulses.  On the right side he has a diminished but palpable posterior tibial pulse.  I cannot palpate pulses in the left foot. He has no significant lower extremity swelling. PULMONARY: There is good air exchange bilaterally without wheezing or rales. ABDOMEN: Soft and non-tender with normal pitched bowel sounds.  I do not palpate an aneurysm. MUSCULOSKELETAL: There are no major deformities. NEUROLOGIC: No focal weakness or paresthesias are detected. SKIN: There are no ulcers or rashes noted. PSYCHIATRIC: The patient has a normal affect.  DATA:     ARTERIAL DOPPLER STUDY: I have independently interpreted his arterial Doppler study today.  On the right side there is a biphasic posterior tibial signal and a biphasic dorsalis pedis signal.  ABIs 92%.  Toe pressure is 96 mmHg.  On the left side there is a monophasic dorsalis pedis and posterior tibial signal.  ABI 79%.  Toe pressure 78 mmHg.   Waverly Ferrari [facility]

## 2021-09-05 ENCOUNTER — Other Ambulatory Visit (HOSPITAL_COMMUNITY): Payer: Self-pay

## 2021-09-05 ENCOUNTER — Encounter: Payer: Self-pay | Admitting: Emergency Medicine

## 2021-09-05 ENCOUNTER — Other Ambulatory Visit: Payer: Self-pay | Admitting: Emergency Medicine

## 2021-09-05 ENCOUNTER — Ambulatory Visit (INDEPENDENT_AMBULATORY_CARE_PROVIDER_SITE_OTHER): Payer: 59 | Admitting: Emergency Medicine

## 2021-09-05 VITALS — BP 136/88 | HR 84 | Temp 98.2°F | Ht 72.0 in | Wt 148.4 lb

## 2021-09-05 DIAGNOSIS — Z13220 Encounter for screening for lipoid disorders: Secondary | ICD-10-CM | POA: Diagnosis not present

## 2021-09-05 DIAGNOSIS — I10 Essential (primary) hypertension: Secondary | ICD-10-CM

## 2021-09-05 DIAGNOSIS — I739 Peripheral vascular disease, unspecified: Secondary | ICD-10-CM | POA: Diagnosis not present

## 2021-09-05 DIAGNOSIS — Z1211 Encounter for screening for malignant neoplasm of colon: Secondary | ICD-10-CM

## 2021-09-05 DIAGNOSIS — M545 Low back pain, unspecified: Secondary | ICD-10-CM

## 2021-09-05 DIAGNOSIS — F17210 Nicotine dependence, cigarettes, uncomplicated: Secondary | ICD-10-CM | POA: Diagnosis not present

## 2021-09-05 DIAGNOSIS — Z23 Encounter for immunization: Secondary | ICD-10-CM

## 2021-09-05 DIAGNOSIS — Z1329 Encounter for screening for other suspected endocrine disorder: Secondary | ICD-10-CM | POA: Diagnosis not present

## 2021-09-05 DIAGNOSIS — Z130 Encounter for screening for diseases of the blood and blood-forming organs and certain disorders involving the immune mechanism: Secondary | ICD-10-CM | POA: Diagnosis not present

## 2021-09-05 DIAGNOSIS — Z125 Encounter for screening for malignant neoplasm of prostate: Secondary | ICD-10-CM

## 2021-09-05 DIAGNOSIS — N529 Male erectile dysfunction, unspecified: Secondary | ICD-10-CM

## 2021-09-05 DIAGNOSIS — Z13228 Encounter for screening for other metabolic disorders: Secondary | ICD-10-CM

## 2021-09-05 DIAGNOSIS — Z114 Encounter for screening for human immunodeficiency virus [HIV]: Secondary | ICD-10-CM

## 2021-09-05 DIAGNOSIS — Z1159 Encounter for screening for other viral diseases: Secondary | ICD-10-CM

## 2021-09-05 DIAGNOSIS — Z0000 Encounter for general adult medical examination without abnormal findings: Secondary | ICD-10-CM

## 2021-09-05 DIAGNOSIS — E785 Hyperlipidemia, unspecified: Secondary | ICD-10-CM

## 2021-09-05 LAB — COMPREHENSIVE METABOLIC PANEL
ALT: 16 U/L (ref 0–53)
AST: 22 U/L (ref 0–37)
Albumin: 5 g/dL (ref 3.5–5.2)
Alkaline Phosphatase: 83 U/L (ref 39–117)
BUN: 11 mg/dL (ref 6–23)
CO2: 27 mEq/L (ref 19–32)
Calcium: 10.3 mg/dL (ref 8.4–10.5)
Chloride: 97 mEq/L (ref 96–112)
Creatinine, Ser: 1.02 mg/dL (ref 0.40–1.50)
GFR: 79.55 mL/min (ref >60.00)
Glucose, Bld: 88 mg/dL (ref 70–99)
Potassium: 4.6 mEq/L (ref 3.5–5.1)
Sodium: 135 mEq/L (ref 135–145)
Total Bilirubin: 0.7 mg/dL (ref 0.2–1.2)
Total Protein: 7.7 g/dL (ref 6.0–8.3)

## 2021-09-05 LAB — CBC WITH DIFFERENTIAL/PLATELET
Basophils Absolute: 0 10*3/uL (ref 0.0–0.1)
Basophils Relative: 0.5 % (ref 0.0–3.0)
Eosinophils Absolute: 0.1 10*3/uL (ref 0.0–0.7)
Eosinophils Relative: 1.2 % (ref 0.0–5.0)
HCT: 45.8 % (ref 39.0–52.0)
Hemoglobin: 15.5 g/dL (ref 13.0–17.0)
Lymphocytes Relative: 36.2 % (ref 12.0–46.0)
Lymphs Abs: 3.1 10*3/uL (ref 0.7–4.0)
MCHC: 33.9 g/dL (ref 30.0–36.0)
MCV: 94.1 fl (ref 78.0–100.0)
Monocytes Absolute: 0.7 10*3/uL (ref 0.1–1.0)
Monocytes Relative: 8.4 % (ref 3.0–12.0)
Neutro Abs: 4.6 10*3/uL (ref 1.4–7.7)
Neutrophils Relative %: 53.7 % (ref 43.0–77.0)
Platelets: 264 10*3/uL (ref 150.0–400.0)
RBC: 4.87 Mil/uL (ref 4.22–5.81)
RDW: 14 % (ref 11.5–15.5)
WBC: 8.5 10*3/uL (ref 4.0–10.5)

## 2021-09-05 LAB — URINALYSIS
Bilirubin Urine: NEGATIVE
Hgb urine dipstick: NEGATIVE
Ketones, ur: 15 — AB
Leukocytes,Ua: NEGATIVE
Nitrite: NEGATIVE
Specific Gravity, Urine: 1.015 (ref 1.000–1.030)
Urine Glucose: NEGATIVE
Urobilinogen, UA: 0.2 (ref 0.0–1.0)
pH: 5.5 (ref 5.0–8.0)

## 2021-09-05 LAB — PSA: PSA: 0.4 ng/mL (ref 0.10–4.00)

## 2021-09-05 LAB — LIPID PANEL
Cholesterol: 231 mg/dL — ABNORMAL HIGH (ref 0–200)
HDL: 65.8 mg/dL (ref >39.00)
LDL Cholesterol: 144 mg/dL — ABNORMAL HIGH (ref 0–99)
NonHDL: 165.64
Total CHOL/HDL Ratio: 4
Triglycerides: 109 mg/dL (ref 0.0–149.0)
VLDL: 21.8 mg/dL (ref 0.0–40.0)

## 2021-09-05 LAB — HEMOGLOBIN A1C: Hgb A1c MFr Bld: 5.5 % (ref 4.6–6.5)

## 2021-09-05 MED ORDER — MELOXICAM 7.5 MG PO TABS
7.5000 mg | ORAL_TABLET | Freq: Every day | ORAL | 1 refills | Status: DC | PRN
  Filled 2021-09-05: qty 30, 30d supply, fill #0

## 2021-09-05 MED ORDER — VARENICLINE TARTRATE 0.5 MG PO TABS
0.5000 mg | ORAL_TABLET | Freq: Two times a day (BID) | ORAL | 5 refills | Status: DC
  Filled 2021-09-05: qty 60, 30d supply, fill #0
  Filled 2022-02-13: qty 60, 30d supply, fill #1

## 2021-09-05 MED ORDER — ROSUVASTATIN CALCIUM 10 MG PO TABS
10.0000 mg | ORAL_TABLET | Freq: Every day | ORAL | 3 refills | Status: DC
  Filled 2021-09-05: qty 90, 90d supply, fill #0
  Filled 2022-01-09: qty 90, 90d supply, fill #1
  Filled 2022-04-18 – 2022-04-20 (×2): qty 90, 90d supply, fill #2
  Filled 2022-08-04: qty 90, 90d supply, fill #3

## 2021-09-05 MED ORDER — AMLODIPINE BESYLATE 10 MG PO TABS
10.0000 mg | ORAL_TABLET | Freq: Every day | ORAL | 3 refills | Status: DC
  Filled 2021-09-05 – 2021-11-09 (×3): qty 90, 90d supply, fill #0
  Filled 2022-02-13: qty 90, 90d supply, fill #1
  Filled 2022-05-31: qty 90, 90d supply, fill #2
  Filled 2022-08-04: qty 90, 90d supply, fill #3

## 2021-09-05 MED ORDER — TADALAFIL 5 MG PO TABS
5.0000 mg | ORAL_TABLET | Freq: Every day | ORAL | 0 refills | Status: DC
  Filled 2021-09-05: qty 30, 15d supply, fill #0

## 2021-09-06 ENCOUNTER — Other Ambulatory Visit (HOSPITAL_COMMUNITY): Payer: Self-pay

## 2021-09-06 LAB — HEPATITIS C ANTIBODY: Hepatitis C Ab: NONREACTIVE

## 2021-09-06 LAB — HIV ANTIBODY (ROUTINE TESTING W REFLEX): HIV 1&2 Ab, 4th Generation: NONREACTIVE

## 2021-09-22 ENCOUNTER — Other Ambulatory Visit (HOSPITAL_COMMUNITY): Payer: Self-pay

## 2021-10-07 ENCOUNTER — Encounter: Payer: Self-pay | Admitting: Internal Medicine

## 2021-10-07 ENCOUNTER — Ambulatory Visit: Payer: 59 | Admitting: Internal Medicine

## 2021-10-07 ENCOUNTER — Ambulatory Visit (INDEPENDENT_AMBULATORY_CARE_PROVIDER_SITE_OTHER): Payer: 59

## 2021-10-07 VITALS — BP 126/78 | HR 53 | Resp 18 | Ht 72.0 in | Wt 149.4 lb

## 2021-10-07 DIAGNOSIS — M79642 Pain in left hand: Secondary | ICD-10-CM | POA: Diagnosis not present

## 2021-10-07 DIAGNOSIS — M898X1 Other specified disorders of bone, shoulder: Secondary | ICD-10-CM | POA: Diagnosis not present

## 2021-10-07 NOTE — Assessment & Plan Note (Signed)
Suspect some arthritis causing enlargement chronically with acute inflammation superimposed. He has meloxicam at home which he does not use regularly. Asked him to take this 7.5 mg daily for 1-2 weeks then resume prn only. Can ice the area as well. No x-ray needed today as fracture not suspected.
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
## 2021-10-07 NOTE — Patient Instructions (Signed)
Take the meloxicam 1 pill daily for the next 1-2 weeks to help the collarbone and the finger.

## 2021-10-25 ENCOUNTER — Other Ambulatory Visit (HOSPITAL_COMMUNITY): Payer: Self-pay

## 2021-11-09 ENCOUNTER — Other Ambulatory Visit (HOSPITAL_COMMUNITY): Payer: Self-pay

## 2021-11-10 ENCOUNTER — Other Ambulatory Visit (HOSPITAL_COMMUNITY): Payer: Self-pay

## 2021-11-23 ENCOUNTER — Telehealth: Payer: Self-pay | Admitting: Emergency Medicine

## 2021-11-23 DIAGNOSIS — L723 Sebaceous cyst: Secondary | ICD-10-CM

## 2021-11-23 NOTE — Telephone Encounter (Signed)
Patient called and states that the dermatology office her was going to is closed and he needs to get in with a new dermatologist as soon as possible. Needs new referral placed. Call back number is [phone]

## 2021-11-23 NOTE — Telephone Encounter (Signed)
Called patient and informed him that his referral has been placed. He will get a call to schedule an appointment

## 2021-11-25 ENCOUNTER — Other Ambulatory Visit (HOSPITAL_COMMUNITY): Payer: Self-pay

## 2021-12-08 ENCOUNTER — Telehealth: Payer: Self-pay

## 2021-12-08 NOTE — Telephone Encounter (Signed)
Called patient to inform him that his colonoscopy is not due yet. Patient had it done in 2015, due very 10 years

## 2021-12-08 NOTE — Telephone Encounter (Signed)
Patient is calling in stating he got a call saying he is due for a colonscopy and wants to know if we can place a referral to GI so he can have this done.

## 2021-12-12 ENCOUNTER — Ambulatory Visit (INDEPENDENT_AMBULATORY_CARE_PROVIDER_SITE_OTHER): Payer: 59 | Admitting: *Deleted

## 2021-12-12 DIAGNOSIS — Z23 Encounter for immunization: Secondary | ICD-10-CM | POA: Diagnosis not present

## 2021-12-12 NOTE — Progress Notes (Signed)
Pls cosign for [facility] inj../lmb  

## 2022-01-03 ENCOUNTER — Other Ambulatory Visit (HOSPITAL_COMMUNITY): Payer: Self-pay

## 2022-01-04 ENCOUNTER — Other Ambulatory Visit (HOSPITAL_COMMUNITY): Payer: Self-pay

## 2022-01-09 ENCOUNTER — Ambulatory Visit: Payer: 59 | Admitting: Dermatology

## 2022-01-09 ENCOUNTER — Other Ambulatory Visit (HOSPITAL_COMMUNITY): Payer: Self-pay

## 2022-02-13 ENCOUNTER — Other Ambulatory Visit (HOSPITAL_COMMUNITY): Payer: Self-pay

## 2022-02-14 ENCOUNTER — Other Ambulatory Visit (HOSPITAL_COMMUNITY): Payer: Self-pay

## 2022-03-20 ENCOUNTER — Encounter: Payer: Self-pay | Admitting: Emergency Medicine

## 2022-03-20 ENCOUNTER — Other Ambulatory Visit (HOSPITAL_COMMUNITY): Payer: Self-pay

## 2022-03-20 ENCOUNTER — Ambulatory Visit (INDEPENDENT_AMBULATORY_CARE_PROVIDER_SITE_OTHER): Payer: Commercial Managed Care - PPO | Admitting: Emergency Medicine

## 2022-03-20 VITALS — BP 126/74 | HR 80 | Temp 98.1°F | Ht 72.0 in | Wt 159.5 lb

## 2022-03-20 DIAGNOSIS — F17200 Nicotine dependence, unspecified, uncomplicated: Secondary | ICD-10-CM

## 2022-03-20 DIAGNOSIS — N529 Male erectile dysfunction, unspecified: Secondary | ICD-10-CM | POA: Diagnosis not present

## 2022-03-20 DIAGNOSIS — N5201 Erectile dysfunction due to arterial insufficiency: Secondary | ICD-10-CM | POA: Diagnosis not present

## 2022-03-20 DIAGNOSIS — Z1211 Encounter for screening for malignant neoplasm of colon: Secondary | ICD-10-CM

## 2022-03-20 DIAGNOSIS — I739 Peripheral vascular disease, unspecified: Secondary | ICD-10-CM | POA: Diagnosis not present

## 2022-03-20 DIAGNOSIS — L723 Sebaceous cyst: Secondary | ICD-10-CM | POA: Diagnosis not present

## 2022-03-20 DIAGNOSIS — I10 Essential (primary) hypertension: Secondary | ICD-10-CM

## 2022-03-20 MED ORDER — SILDENAFIL CITRATE 100 MG PO TABS
50.0000 mg | ORAL_TABLET | Freq: Every day | ORAL | 11 refills | Status: DC | PRN
  Filled 2022-03-20: qty 10, 10d supply, fill #0
  Filled 2022-04-18 – 2022-04-20 (×2): qty 10, 10d supply, fill #1
  Filled 2022-05-31: qty 10, 10d supply, fill #2
  Filled 2022-08-04 (×2): qty 10, 10d supply, fill #3
  Filled 2022-08-04: qty 6, 30d supply, fill #3
  Filled 2022-09-11: qty 10, 30d supply, fill #4
  Filled 2022-09-11: qty 6, 30d supply, fill #4

## 2022-03-20 NOTE — Assessment & Plan Note (Signed)
To back of the neck.  Wants it removed. Dermatology referral placed today.

## 2022-03-20 NOTE — Patient Instructions (Signed)
Hypertension, Adult High blood pressure (hypertension) is when the force of blood pumping through the arteries is too strong. The arteries are the blood vessels that carry blood from the heart throughout the body. Hypertension forces the heart to work harder to pump blood and may cause arteries to become narrow or stiff. Untreated or uncontrolled hypertension can lead to a heart attack, heart failure, a stroke, kidney disease, and other problems. A blood pressure reading consists of a higher number over a lower number. Ideally, your blood pressure should be below 120/80. The first ("top") number is called the systolic pressure. It is a measure of the pressure in your arteries as your heart beats. The second ("bottom") number is called the diastolic pressure. It is a measure of the pressure in your arteries as the heart relaxes. What are the causes? The exact cause of this condition is not known. There are some conditions that result in high blood pressure. What increases the risk? Certain factors may make you more likely to develop high blood pressure. Some of these risk factors are under your control, including: Smoking. Not getting enough exercise or physical activity. Being overweight. Having too much fat, sugar, calories, or salt (sodium) in your diet. Drinking too much alcohol. Other risk factors include: Having a personal history of heart disease, diabetes, high cholesterol, or kidney disease. Stress. Having a family history of high blood pressure and high cholesterol. Having obstructive sleep apnea. Age. The risk increases with age. What are the signs or symptoms? High blood pressure may not cause symptoms. Very high blood pressure (hypertensive crisis) may cause: Headache. Fast or irregular heartbeats (palpitations). Shortness of breath. Nosebleed. Nausea and vomiting. Vision changes. Severe chest pain, dizziness, and seizures. How is this diagnosed? This condition is diagnosed by  measuring your blood pressure while you are seated, with your arm resting on a flat surface, your legs uncrossed, and your feet flat on the floor. The cuff of the blood pressure monitor will be placed directly against the skin of your upper arm at the level of your heart. Blood pressure should be measured at least twice using the same arm. Certain conditions can cause a difference in blood pressure between your right and left arms. If you have a high blood pressure reading during one visit or you have normal blood pressure with other risk factors, you may be asked to: Return on a different day to have your blood pressure checked again. Monitor your blood pressure at home for 1 week or longer. If you are diagnosed with hypertension, you may have other blood or imaging tests to help your health care provider understand your overall risk for other conditions. How is this treated? This condition is treated by making healthy lifestyle changes, such as eating healthy foods, exercising more, and reducing your alcohol intake. You may be referred for counseling on a healthy diet and physical activity. Your health care provider may prescribe medicine if lifestyle changes are not enough to get your blood pressure under control and if: Your systolic blood pressure is above 130. Your diastolic blood pressure is above 80. Your personal target blood pressure may vary depending on your medical conditions, your age, and other factors. Follow these instructions at home: Eating and drinking  Eat a diet that is high in fiber and potassium, and low in sodium, added sugar, and fat. An example of this eating plan is called the DASH diet. DASH stands for Dietary Approaches to Stop Hypertension. To eat this way: Eat   plenty of fresh fruits and vegetables. Try to fill one half of your plate at each meal with fruits and vegetables. Eat whole grains, such as whole-wheat pasta, brown rice, or whole-grain bread. Fill about one  fourth of your plate with whole grains. Eat or drink low-fat dairy products, such as skim milk or low-fat yogurt. Avoid fatty cuts of meat, processed or cured meats, and poultry with skin. Fill about one fourth of your plate with lean proteins, such as fish, chicken without skin, beans, eggs, or tofu. Avoid pre-made and processed foods. These tend to be higher in sodium, added sugar, and fat. Reduce your daily sodium intake. Many people with hypertension should eat less than 1,500 mg of sodium a day. Do not drink alcohol if: Your health care provider tells you not to drink. You are pregnant, may be pregnant, or are planning to become pregnant. If you drink alcohol: Limit how much you have to: 0-1 drink a day for women. 0-2 drinks a day for men. Know how much alcohol is in your drink. In the [place] one drink equals one 12 oz bottle of beer (355 mL), one 5 oz glass of wine (148 mL), or one 1 oz glass of hard liquor (44 mL). Lifestyle  Work with your health care provider to maintain a healthy body weight or to lose weight. Ask what an ideal weight is for you. Get at least 30 minutes of exercise that causes your heart to beat faster (aerobic exercise) most days of the week. Activities may include walking, swimming, or biking. Include exercise to strengthen your muscles (resistance exercise), such as Pilates or lifting weights, as part of your weekly exercise routine. Try to do these types of exercises for 30 minutes at least 3 days a week. Do not use any products that contain nicotine or tobacco. These products include cigarettes, chewing tobacco, and vaping devices, such as e-cigarettes. If you need help quitting, ask your health care provider. Monitor your blood pressure at home as told by your health care provider. Keep all follow-up visits. This is important. Medicines Take over-the-counter and prescription medicines only as told by your health care provider. Follow directions carefully. Blood  pressure medicines must be taken as prescribed. Do not skip doses of blood pressure medicine. Doing this puts you at risk for problems and can make the medicine less effective. Ask your health care provider about side effects or reactions to medicines that you should watch for. Contact a health care provider if you: Think you are having a reaction to a medicine you are taking. Have headaches that keep coming back (recurring). Feel dizzy. Have swelling in your ankles. Have trouble with your vision. Get help right away if you: Develop a severe headache or confusion. Have unusual weakness or numbness. Feel faint. Have severe pain in your chest or abdomen. Vomit repeatedly. Have trouble breathing. These symptoms may be an emergency. Get help right away. Call 911. Do not wait to see if the symptoms will go away. Do not drive yourself to the hospital. Summary Hypertension is when the force of blood pumping through your arteries is too strong. If this condition is not controlled, it may put you at risk for serious complications. Your personal target blood pressure may vary depending on your medical conditions, your age, and other factors. For most people, a normal blood pressure is less than 120/80. Hypertension is treated with lifestyle changes, medicines, or a combination of both. Lifestyle changes include losing weight, eating a healthy,   low-sodium diet, exercising more, and limiting alcohol. This information is not intended to replace advice given to you by your health care provider. Make sure you discuss any questions you have with your health care provider. Document Revised: 01/04/2021 Document Reviewed: 01/04/2021 Elsevier Patient Education  2023 Elsevier Inc.  

## 2022-03-20 NOTE — Assessment & Plan Note (Signed)
Still a problem.  Requesting higher dose of Viagra Advised to take 100 mg of Viagra on an empty stomach and avoid alcohol consumption

## 2022-03-20 NOTE — Assessment & Plan Note (Signed)
Well-controlled hypertension. Continue Benicar HCT 40-12.5 mg daily. Continue amlodipine 10 mg daily. Cardiovascular risk associated with hypertension discussed. BP Readings from Last 3 Encounters:  03/20/22 126/74  10/07/21 126/78  09/05/21 136/88

## 2022-03-20 NOTE — Assessment & Plan Note (Signed)
Cardiovascular/cancer risk associated with smoking discussed. Smoking cessation advice given 

## 2022-03-20 NOTE — Assessment & Plan Note (Signed)
Stable.  No concerns. °

## 2022-03-20 NOTE — Progress Notes (Signed)
Walter Odom 62 y.o.   Chief Complaint  Patient presents with   Follow-up    f/u app, pt has questions about the cyst on his neck, was referred to dermatology. Patient wants a higher does of his Viagra     HISTORY OF PRESENT ILLNESS: This is a 62 y.o. male here for 77-month follow-up of hypertension. Has sebaceous cyst to back of the neck.  Wants it removed. Also requesting higher dose of Viagra.  Has history of peripheral artery disease. No other complaints or medical concerns today.  HPI   Prior to Admission medications   Medication Sig Start Date End Date Taking? Authorizing Provider  amLODipine (NORVASC) 10 MG tablet Take 1 tablet (10 mg total) by mouth daily. 09/05/21  Yes Damere Brandenburg, Eilleen Kempf, MD  meloxicam (MOBIC) 7.5 MG tablet Take 1 tablet (7.5 mg total) by mouth daily as needed for pain. 09/05/21  Yes [place], Eilleen Kempf, MD  methocarbamol (ROBAXIN) 500 MG tablet Take 1-2 tablets (500-1,000 mg total) by mouth 3 (three) times daily as needed for muscle spasms. 04/18/21  Yes Hudnell, Judeth Cornfield, NP  olmesartan-hydrochlorothiazide (BENICAR HCT) 40-12.5 MG tablet Take 1 tablet by mouth daily.   Yes [provider]  rosuvastatin (CRESTOR) 10 MG tablet Take 1 tablet (10 mg total) by mouth daily. 09/05/21  Yes Irving Lubbers, Eilleen Kempf, MD  varenicline (CHANTIX) 0.5 MG tablet Take 1 tablet (0.5 mg total) by mouth 2 (two) times daily. START with 1 pill daily AFTER eating, then increase to twice a day if no nausea 09/05/21  Yes Kristian Mogg, Eilleen Kempf, MD  sildenafil (VIAGRA) 100 MG tablet Take 0.5-1 tablets (50-100 mg total) by mouth daily as needed for erectile dysfunction. 03/20/22   Georgina Quint, MD    No Known Allergies  Patient Active Problem List   Diagnosis Date Noted   Current smoker 03/20/2022   Chronic pain of both knees 06/27/2021   Sebaceous cyst 06/27/2021   Essential hypertension 04/18/2021   Peripheral vascular disease (HCC) 04/18/2021   Erectile  dysfunction 04/18/2021   Cigarette nicotine dependence without complication 04/18/2021    Past Medical History:  Diagnosis Date   Hypertension     Past Surgical History:  Procedure Laterality Date   ABDOMINAL AORTOGRAM W/LOWER EXTREMITY N/A 01/08/2017   Procedure: ABDOMINAL AORTOGRAM W/LOWER EXTREMITY;  Surgeon: Chuck Hint, MD;  Location: [facility] INVASIVE CV LAB;  Service: Cardiovascular;  Laterality: N/A;   PERIPHERAL VASCULAR BALLOON ANGIOPLASTY Left 01/08/2017   Procedure: PERIPHERAL VASCULAR BALLOON ANGIOPLASTY UNSUCCESSFUL;  Surgeon: Chuck Hint, MD;  Location: [facility] INVASIVE CV LAB;  Service: Cardiovascular;  Laterality: Left;    Social History   Socioeconomic History   Marital status: Married    Spouse name: Not on file   Number of children: Not on file   Years of education: Not on file   Highest education level: Not on file  Occupational History    Comment: [employer]  Tobacco Use   Smoking status: Every Day    Packs/day: 0.50    Types: Cigarettes   Smokeless tobacco: Never  Vaping Use   Vaping Use: Never used  Substance and Sexual Activity   Alcohol use: Yes   Drug use: Not on file   Sexual activity: Not on file  Other Topics Concern   Not on file  Social History Narrative   pt reports working for [employer] for over 30 years   Social Determinants of Health   Financial Resource Strain: Not on file  Food Insecurity: Not on file  Transportation Needs: Not on file  Physical Activity: Not on file  Stress: Not on file  Social Connections: Not on file  Intimate Partner Violence: Not on file    No family history on file.   Review of Systems  Constitutional: Negative.  Negative for chills and fever.  HENT: Negative.  Negative for congestion and sore throat.   Respiratory: Negative.  Negative for cough and shortness of breath.   Cardiovascular: Negative.  Negative for chest pain and palpitations.  Gastrointestinal:  Negative  for abdominal pain, nausea and vomiting.  Genitourinary: Negative.   Skin: Negative.  Negative for rash.  Neurological: Negative.  Negative for dizziness and headaches.  All other systems reviewed and are negative.  Today's Vitals   03/20/22 0902  BP: 126/74  Pulse: 80  Temp: 98.1 F (36.7 C)  TempSrc: Oral  SpO2: 91%  Weight: 159 lb 8 oz (72.3 kg)  Height: 6' (1.829 m)   Body mass index is 21.63 kg/m.   Physical Exam Vitals reviewed.  Constitutional:      Appearance: Normal appearance.  HENT:     Head: Normocephalic.  Eyes:     Extraocular Movements: Extraocular movements intact.     Pupils: Pupils are equal, round, and reactive to light.  Cardiovascular:     Rate and Rhythm: Normal rate and regular rhythm.     Pulses: Normal pulses.     Heart sounds: Normal heart sounds.  Pulmonary:     Effort: Pulmonary effort is normal.     Breath sounds: Normal breath sounds.  Musculoskeletal:     Cervical back: No tenderness.  Lymphadenopathy:     Cervical: No cervical adenopathy.  Skin:    General: Skin is warm and dry.     Comments: Sebaceous nontender noninfected cyst to posterior neck Protrusions to sternoclavicular joints.  Chronic  Neurological:     General: No focal deficit present.     Mental Status: He is alert and oriented to person, place, and time.  Psychiatric:        Mood and Affect: Mood normal.        Behavior: Behavior normal.      ASSESSMENT & PLAN: A total of 44 minutes was spent with the patient and counseling/coordination of care regarding preparing for this visit, review of most recent office visit notes, review of multiple chronic medical conditions under management, review of all medications, cardiovascular risk associated with hypertension and smoking, smoking cessation advised, education on nutrition, diagnosis of erectile dysfunction and treatment, prognosis, documentation, need for follow-up.  Problem List Items Addressed This Visit        Cardiovascular and Mediastinum   Essential hypertension - Primary    Well-controlled hypertension. Continue Benicar HCT 40-12.5 mg daily. Continue amlodipine 10 mg daily. Cardiovascular risk associated with hypertension discussed. BP Readings from Last 3 Encounters:  03/20/22 126/74  10/07/21 126/78  09/05/21 136/88        Relevant Medications   sildenafil (VIAGRA) 100 MG tablet   Peripheral vascular disease (HCC)    Stable.  No concerns.      Relevant Medications   sildenafil (VIAGRA) 100 MG tablet     Musculoskeletal and Integument   Sebaceous cyst    To back of the neck.  Wants it removed. Dermatology referral placed today.      Relevant Orders   Ambulatory referral to Dermatology     Other   Erectile dysfunction    Still a  problem.  Requesting higher dose of Viagra Advised to take 100 mg of Viagra on an empty stomach and avoid alcohol consumption      Relevant Medications   sildenafil (VIAGRA) 100 MG tablet   Current smoker    Cardiovascular/cancer risk associated with smoking discussed. Smoking cessation advice given.      Other Visit Diagnoses     Colon cancer screening       Relevant Orders   Cologuard        Patient Instructions  Hypertension, Adult High blood pressure (hypertension) is when the force of blood pumping through the arteries is too strong. The arteries are the blood vessels that carry blood from the heart throughout the body. Hypertension forces the heart to work harder to pump blood and may cause arteries to become narrow or stiff. Untreated or uncontrolled hypertension can lead to a heart attack, heart failure, a stroke, kidney disease, and other problems. A blood pressure reading consists of a higher number over a lower number. Ideally, your blood pressure should be below 120/80. The first ("top") number is called the systolic pressure. It is a measure of the pressure in your arteries as your heart beats. The second ("bottom") number  is called the diastolic pressure. It is a measure of the pressure in your arteries as the heart relaxes. What are the causes? The exact cause of this condition is not known. There are some conditions that result in high blood pressure. What increases the risk? Certain factors may make you more likely to develop high blood pressure. Some of these risk factors are under your control, including: Smoking. Not getting enough exercise or physical activity. Being overweight. Having too much fat, sugar, calories, or salt (sodium) in your diet. Drinking too much alcohol. Other risk factors include: Having a personal history of heart disease, diabetes, high cholesterol, or kidney disease. Stress. Having a family history of high blood pressure and high cholesterol. Having obstructive sleep apnea. Age. The risk increases with age. What are the signs or symptoms? High blood pressure may not cause symptoms. Very high blood pressure (hypertensive crisis) may cause: Headache. Fast or irregular heartbeats (palpitations). Shortness of breath. Nosebleed. Nausea and vomiting. Vision changes. Severe chest pain, dizziness, and seizures. How is this diagnosed? This condition is diagnosed by measuring your blood pressure while you are seated, with your arm resting on a flat surface, your legs uncrossed, and your feet flat on the floor. The cuff of the blood pressure monitor will be placed directly against the skin of your upper arm at the level of your heart. Blood pressure should be measured at least twice using the same arm. Certain conditions can cause a difference in blood pressure between your right and left arms. If you have a high blood pressure reading during one visit or you have normal blood pressure with other risk factors, you may be asked to: Return on a different day to have your blood pressure checked again. Monitor your blood pressure at home for 1 week or longer. If you are diagnosed with  hypertension, you may have other blood or imaging tests to help your health care provider understand your overall risk for other conditions. How is this treated? This condition is treated by making healthy lifestyle changes, such as eating healthy foods, exercising more, and reducing your alcohol intake. You may be referred for counseling on a healthy diet and physical activity. Your health care provider may prescribe medicine if lifestyle changes are not enough to  get your blood pressure under control and if: Your systolic blood pressure is above 130. Your diastolic blood pressure is above 80. Your personal target blood pressure may vary depending on your medical conditions, your age, and other factors. Follow these instructions at home: Eating and drinking  Eat a diet that is high in fiber and potassium, and low in sodium, added sugar, and fat. An example of this eating plan is called the DASH diet. DASH stands for Dietary Approaches to Stop Hypertension. To eat this way: Eat plenty of fresh fruits and vegetables. Try to fill one half of your plate at each meal with fruits and vegetables. Eat whole grains, such as whole-wheat pasta, brown rice, or whole-grain bread. Fill about one fourth of your plate with whole grains. Eat or drink low-fat dairy products, such as skim milk or low-fat yogurt. Avoid fatty cuts of meat, processed or cured meats, and poultry with skin. Fill about one fourth of your plate with lean proteins, such as fish, chicken without skin, beans, eggs, or tofu. Avoid pre-made and processed foods. These tend to be higher in sodium, added sugar, and fat. Reduce your daily sodium intake. Many people with hypertension should eat less than 1,500 mg of sodium a day. Do not drink alcohol if: Your health care provider tells you not to drink. You are pregnant, may be pregnant, or are planning to become pregnant. If you drink alcohol: Limit how much you have to: 0-1 drink a day for  women. 0-2 drinks a day for men. Know how much alcohol is in your drink. In the [place] one drink equals one 12 oz bottle of beer (355 mL), one 5 oz glass of wine (148 mL), or one 1 oz glass of hard liquor (44 mL). Lifestyle  Work with your health care provider to maintain a healthy body weight or to lose weight. Ask what an ideal weight is for you. Get at least 30 minutes of exercise that causes your heart to beat faster (aerobic exercise) most days of the week. Activities may include walking, swimming, or biking. Include exercise to strengthen your muscles (resistance exercise), such as Pilates or lifting weights, as part of your weekly exercise routine. Try to do these types of exercises for 30 minutes at least 3 days a week. Do not use any products that contain nicotine or tobacco. These products include cigarettes, chewing tobacco, and vaping devices, such as e-cigarettes. If you need help quitting, ask your health care provider. Monitor your blood pressure at home as told by your health care provider. Keep all follow-up visits. This is important. Medicines Take over-the-counter and prescription medicines only as told by your health care provider. Follow directions carefully. Blood pressure medicines must be taken as prescribed. Do not skip doses of blood pressure medicine. Doing this puts you at risk for problems and can make the medicine less effective. Ask your health care provider about side effects or reactions to medicines that you should watch for. Contact a health care provider if you: Think you are having a reaction to a medicine you are taking. Have headaches that keep coming back (recurring). Feel dizzy. Have swelling in your ankles. Have trouble with your vision. Get help right away if you: Develop a severe headache or confusion. Have unusual weakness or numbness. Feel faint. Have severe pain in your chest or abdomen. Vomit repeatedly. Have trouble breathing. These  symptoms may be an emergency. Get help right away. Call 911. Do not wait to see if  the symptoms will go away. Do not drive yourself to the hospital. Summary Hypertension is when the force of blood pumping through your arteries is too strong. If this condition is not controlled, it may put you at risk for serious complications. Your personal target blood pressure may vary depending on your medical conditions, your age, and other factors. For most people, a normal blood pressure is less than 120/80. Hypertension is treated with lifestyle changes, medicines, or a combination of both. Lifestyle changes include losing weight, eating a healthy, low-sodium diet, exercising more, and limiting alcohol. This information is not intended to replace advice given to you by your health care provider. Make sure you discuss any questions you have with your health care provider. Document Revised: 01/04/2021 Document Reviewed: 01/04/2021 Elsevier Patient Education  2023 Elsevier Inc.    Edwina Barth, MD [facility]

## 2022-04-03 DIAGNOSIS — L728 Other follicular cysts of the skin and subcutaneous tissue: Secondary | ICD-10-CM | POA: Diagnosis not present

## 2022-04-19 DIAGNOSIS — L538 Other specified erythematous conditions: Secondary | ICD-10-CM | POA: Diagnosis not present

## 2022-04-19 DIAGNOSIS — R208 Other disturbances of skin sensation: Secondary | ICD-10-CM | POA: Diagnosis not present

## 2022-04-19 DIAGNOSIS — L728 Other follicular cysts of the skin and subcutaneous tissue: Secondary | ICD-10-CM | POA: Diagnosis not present

## 2022-04-19 DIAGNOSIS — L723 Sebaceous cyst: Secondary | ICD-10-CM | POA: Diagnosis not present

## 2022-04-20 ENCOUNTER — Other Ambulatory Visit (HOSPITAL_COMMUNITY): Payer: Self-pay

## 2022-06-01 ENCOUNTER — Other Ambulatory Visit (HOSPITAL_COMMUNITY): Payer: Self-pay

## 2022-06-01 ENCOUNTER — Other Ambulatory Visit: Payer: Self-pay

## 2022-08-04 ENCOUNTER — Other Ambulatory Visit (HOSPITAL_COMMUNITY): Payer: Self-pay

## 2022-08-04 ENCOUNTER — Other Ambulatory Visit: Payer: Self-pay

## 2022-08-14 ENCOUNTER — Other Ambulatory Visit (HOSPITAL_BASED_OUTPATIENT_CLINIC_OR_DEPARTMENT_OTHER): Payer: Self-pay

## 2022-09-11 ENCOUNTER — Other Ambulatory Visit (HOSPITAL_COMMUNITY): Payer: Self-pay

## 2022-09-21 ENCOUNTER — Other Ambulatory Visit (HOSPITAL_COMMUNITY): Payer: Self-pay

## 2022-09-21 ENCOUNTER — Other Ambulatory Visit: Payer: Self-pay

## 2022-09-21 ENCOUNTER — Other Ambulatory Visit: Payer: Self-pay | Admitting: Emergency Medicine

## 2022-09-21 DIAGNOSIS — I10 Essential (primary) hypertension: Secondary | ICD-10-CM

## 2022-09-21 MED ORDER — AMLODIPINE BESYLATE 10 MG PO TABS
10.0000 mg | ORAL_TABLET | Freq: Every day | ORAL | 3 refills | Status: DC
  Filled 2022-09-21: qty 90, 90d supply, fill #0
  Filled 2023-01-08: qty 90, 90d supply, fill #1
  Filled 2023-04-16: qty 90, 90d supply, fill #2
  Filled 2023-07-25: qty 90, 90d supply, fill #3

## 2022-09-22 ENCOUNTER — Other Ambulatory Visit (HOSPITAL_COMMUNITY): Payer: Self-pay

## 2022-10-23 ENCOUNTER — Encounter: Payer: Self-pay | Admitting: Emergency Medicine

## 2022-10-23 ENCOUNTER — Other Ambulatory Visit (HOSPITAL_COMMUNITY): Payer: Self-pay

## 2022-10-23 ENCOUNTER — Ambulatory Visit (INDEPENDENT_AMBULATORY_CARE_PROVIDER_SITE_OTHER): Payer: Commercial Managed Care - PPO | Admitting: Emergency Medicine

## 2022-10-23 VITALS — BP 122/88 | HR 52 | Temp 98.0°F | Ht 72.0 in | Wt 155.5 lb

## 2022-10-23 DIAGNOSIS — M25562 Pain in left knee: Secondary | ICD-10-CM | POA: Diagnosis not present

## 2022-10-23 DIAGNOSIS — Z13228 Encounter for screening for other metabolic disorders: Secondary | ICD-10-CM | POA: Diagnosis not present

## 2022-10-23 DIAGNOSIS — Z125 Encounter for screening for malignant neoplasm of prostate: Secondary | ICD-10-CM

## 2022-10-23 DIAGNOSIS — Z0001 Encounter for general adult medical examination with abnormal findings: Secondary | ICD-10-CM

## 2022-10-23 DIAGNOSIS — Z130 Encounter for screening for diseases of the blood and blood-forming organs and certain disorders involving the immune mechanism: Secondary | ICD-10-CM | POA: Diagnosis not present

## 2022-10-23 DIAGNOSIS — Z0000 Encounter for general adult medical examination without abnormal findings: Secondary | ICD-10-CM

## 2022-10-23 DIAGNOSIS — F17200 Nicotine dependence, unspecified, uncomplicated: Secondary | ICD-10-CM

## 2022-10-23 DIAGNOSIS — M25561 Pain in right knee: Secondary | ICD-10-CM

## 2022-10-23 DIAGNOSIS — I10 Essential (primary) hypertension: Secondary | ICD-10-CM

## 2022-10-23 DIAGNOSIS — N5201 Erectile dysfunction due to arterial insufficiency: Secondary | ICD-10-CM | POA: Diagnosis not present

## 2022-10-23 DIAGNOSIS — Z13220 Encounter for screening for lipoid disorders: Secondary | ICD-10-CM

## 2022-10-23 DIAGNOSIS — Z1329 Encounter for screening for other suspected endocrine disorder: Secondary | ICD-10-CM | POA: Diagnosis not present

## 2022-10-23 DIAGNOSIS — Z1211 Encounter for screening for malignant neoplasm of colon: Secondary | ICD-10-CM

## 2022-10-23 DIAGNOSIS — G8929 Other chronic pain: Secondary | ICD-10-CM

## 2022-10-23 LAB — COMPREHENSIVE METABOLIC PANEL
ALT: 34 U/L (ref 0–53)
AST: 26 U/L (ref 0–37)
Albumin: 4.5 g/dL (ref 3.5–5.2)
Alkaline Phosphatase: 93 U/L (ref 39–117)
BUN: 9 mg/dL (ref 6–23)
CO2: 27 mEq/L (ref 19–32)
Calcium: 9.4 mg/dL (ref 8.4–10.5)
Chloride: 104 mEq/L (ref 96–112)
Creatinine, Ser: 0.9 mg/dL (ref 0.40–1.50)
GFR: 91.71 mL/min (ref >60.00)
Glucose, Bld: 102 mg/dL — ABNORMAL HIGH (ref 70–99)
Potassium: 4.4 mEq/L (ref 3.5–5.1)
Sodium: 137 mEq/L (ref 135–145)
Total Bilirubin: 0.2 mg/dL (ref 0.2–1.2)
Total Protein: 6.9 g/dL (ref 6.0–8.3)

## 2022-10-23 LAB — LIPID PANEL
Cholesterol: 145 mg/dL (ref 0–200)
HDL: 43.1 mg/dL (ref >39.00)
LDL Cholesterol: 83 mg/dL (ref 0–99)
NonHDL: 102.16
Total CHOL/HDL Ratio: 3
Triglycerides: 94 mg/dL (ref 0.0–149.0)
VLDL: 18.8 mg/dL (ref 0.0–40.0)

## 2022-10-23 LAB — CBC WITH DIFFERENTIAL/PLATELET
Basophils Absolute: 0 10*3/uL (ref 0.0–0.1)
Basophils Relative: 0.4 % (ref 0.0–3.0)
Eosinophils Absolute: 0.3 10*3/uL (ref 0.0–0.7)
Eosinophils Relative: 2.8 % (ref 0.0–5.0)
HCT: 43.8 % (ref 39.0–52.0)
Hemoglobin: 14.2 g/dL (ref 13.0–17.0)
Lymphocytes Relative: 33.4 % (ref 12.0–46.0)
Lymphs Abs: 3.1 10*3/uL (ref 0.7–4.0)
MCHC: 32.3 g/dL (ref 30.0–36.0)
MCV: 93.6 fl (ref 78.0–100.0)
Monocytes Absolute: 0.7 10*3/uL (ref 0.1–1.0)
Monocytes Relative: 8.1 % (ref 3.0–12.0)
Neutro Abs: 5.1 10*3/uL (ref 1.4–7.7)
Neutrophils Relative %: 55.3 % (ref 43.0–77.0)
Platelets: 260 10*3/uL (ref 150.0–400.0)
RBC: 4.69 Mil/uL (ref 4.22–5.81)
RDW: 14.3 % (ref 11.5–15.5)
WBC: 9.2 10*3/uL (ref 4.0–10.5)

## 2022-10-23 LAB — HEMOGLOBIN A1C: Hgb A1c MFr Bld: 5.9 % (ref 4.6–6.5)

## 2022-10-23 LAB — PSA: PSA: 0.49 ng/mL (ref 0.10–4.00)

## 2022-10-23 MED ORDER — TADALAFIL 20 MG PO TABS
10.0000 mg | ORAL_TABLET | ORAL | 11 refills | Status: DC | PRN
  Filled 2022-10-23: qty 10, 20d supply, fill #0
  Filled 2022-11-16: qty 10, 20d supply, fill #1

## 2022-10-23 NOTE — Assessment & Plan Note (Signed)
Well-controlled blood pressure with normal numbers in the office Continue amlodipine 10 mg daily Cardiovascular risks associated with hypertension discussed Dietary approaches to stop hypertension discussed

## 2022-10-23 NOTE — Assessment & Plan Note (Signed)
Still actively smoking Cardiovascular/cancer risk associated with smoking discussed. Smoking cessation advice given

## 2022-10-23 NOTE — Patient Instructions (Signed)
Health Maintenance, Male Adopting a healthy lifestyle and getting preventive care are important in promoting health and wellness. Ask your health care provider about: The right schedule for you to have regular tests and exams. Things you can do on your own to prevent diseases and keep yourself healthy. What should I know about diet, weight, and exercise? Eat a healthy diet  Eat a diet that includes plenty of vegetables, fruits, low-fat dairy products, and lean protein. Do not eat a lot of foods that are high in solid fats, added sugars, or sodium. Maintain a healthy weight Body mass index (BMI) is a measurement that can be used to identify possible weight problems. It estimates body fat based on height and weight. Your health care provider can help determine your BMI and help you achieve or maintain a healthy weight. Get regular exercise Get regular exercise. This is one of the most important things you can do for your health. Most adults should: Exercise for at least 150 minutes each week. The exercise should increase your heart rate and make you sweat (moderate-intensity exercise). Do strengthening exercises at least twice a week. This is in addition to the moderate-intensity exercise. Spend less time sitting. Even light physical activity can be beneficial. Watch cholesterol and blood lipids Have your blood tested for lipids and cholesterol at 62 years of age, then have this test every 5 years. You may need to have your cholesterol levels checked more often if: Your lipid or cholesterol levels are high. You are older than 62 years of age. You are at high risk for heart disease. What should I know about cancer screening? Many types of cancers can be detected early and may often be prevented. Depending on your health history and family history, you may need to have cancer screening at various ages. This may include screening for: Colorectal cancer. Prostate cancer. Skin cancer. Lung  cancer. What should I know about heart disease, diabetes, and high blood pressure? Blood pressure and heart disease High blood pressure causes heart disease and increases the risk of stroke. This is more likely to develop in people who have high blood pressure readings or are overweight. Talk with your health care provider about your target blood pressure readings. Have your blood pressure checked: Every 3-5 years if you are 18-39 years of age. Every year if you are 40 years old or older. If you are between the ages of 65 and 75 and are a current or former smoker, ask your health care provider if you should have a one-time screening for abdominal aortic aneurysm (AAA). Diabetes Have regular diabetes screenings. This checks your fasting blood sugar level. Have the screening done: Once every three years after age 45 if you are at a normal weight and have a low risk for diabetes. More often and at a younger age if you are overweight or have a high risk for diabetes. What should I know about preventing infection? Hepatitis B If you have a higher risk for hepatitis B, you should be screened for this virus. Talk with your health care provider to find out if you are at risk for hepatitis B infection. Hepatitis C Blood testing is recommended for: Everyone born from 1945 through 1965. Anyone with known risk factors for hepatitis C. Sexually transmitted infections (STIs) You should be screened each year for STIs, including gonorrhea and chlamydia, if: You are sexually active and are younger than 62 years of age. You are older than 62 years of age and your   health care provider tells you that you are at risk for this type of infection. Your sexual activity has changed since you were last screened, and you are at increased risk for chlamydia or gonorrhea. Ask your health care provider if you are at risk. Ask your health care provider about whether you are at high risk for HIV. Your health care provider  may recommend a prescription medicine to help prevent HIV infection. If you choose to take medicine to prevent HIV, you should first get tested for HIV. You should then be tested every 3 months for as long as you are taking the medicine. Follow these instructions at home: Alcohol use Do not drink alcohol if your health care provider tells you not to drink. If you drink alcohol: Limit how much you have to 0-2 drinks a day. Know how much alcohol is in your drink. In the [place] one drink equals one 12 oz bottle of beer (355 mL), one 5 oz glass of wine (148 mL), or one 1 oz glass of hard liquor (44 mL). Lifestyle Do not use any products that contain nicotine or tobacco. These products include cigarettes, chewing tobacco, and vaping devices, such as e-cigarettes. If you need help quitting, ask your health care provider. Do not use street drugs. Do not share needles. Ask your health care provider for help if you need support or information about quitting drugs. General instructions Schedule regular health, dental, and eye exams. Stay current with your vaccines. Tell your health care provider if: You often feel depressed. You have ever been abused or do not feel safe at home. Summary Adopting a healthy lifestyle and getting preventive care are important in promoting health and wellness. Follow your health care provider's instructions about healthy diet, exercising, and getting tested or screened for diseases. Follow your health care provider's instructions on monitoring your cholesterol and blood pressure. This information is not intended to replace advice given to you by your health care provider. Make sure you discuss any questions you have with your health care provider. Document Revised: 07/19/2020 Document Reviewed: 07/19/2020 Elsevier Patient Education  2024 Elsevier Inc.  

## 2022-10-23 NOTE — Assessment & Plan Note (Signed)
States Viagra not working as well. Recommend to try Cialis 20 mg daily New prescription sent to pharmacy of record today

## 2022-10-23 NOTE — Progress Notes (Signed)
Walter Odom 62 y.o.   Chief Complaint  Patient presents with   Annual Exam    No concerns     HISTORY OF PRESENT ILLNESS: This is a 62 y.o. male here for annual exam. Still smoking. Sildenafil not working too well.  Wants different option No other complaints or medical concerns today. BP Readings from Last 3 Encounters:  10/23/22 122/88  03/20/22 126/74  10/07/21 126/78     HPI   Prior to Admission medications   Medication Sig Start Date End Date Taking? Authorizing Provider  amLODipine (NORVASC) 10 MG tablet Take 1 tablet (10 mg total) by mouth daily. 09/21/22  Yes Marcelis Wissner, Eilleen Kempf, MD  rosuvastatin (CRESTOR) 10 MG tablet Take 1 tablet (10 mg total) by mouth daily. 09/05/21  Yes Daltin Crist, Eilleen Kempf, MD  meloxicam (MOBIC) 7.5 MG tablet Take 1 tablet (7.5 mg total) by mouth daily as needed for pain. Patient not taking: Reported on 10/23/2022 09/05/21   Georgina Quint, MD  methocarbamol (ROBAXIN) 500 MG tablet Take 1-2 tablets (500-1,000 mg total) by mouth 3 (three) times daily as needed for muscle spasms. Patient not taking: Reported on 10/23/2022 04/18/21   Dulce Sellar, NP  olmesartan-hydrochlorothiazide (BENICAR HCT) 40-12.5 MG tablet Take 1 tablet by mouth daily. Patient not taking: Reported on 10/23/2022    [provider]  sildenafil (VIAGRA) 100 MG tablet Take 1/2-1 tablet (50-100 mg total) by mouth daily as needed for erectile dysfunction. Patient not taking: Reported on 10/23/2022 03/20/22   Georgina Quint, MD  varenicline (CHANTIX) 0.5 MG tablet Take 1 tablet (0.5 mg total) by mouth 2 (two) times daily. START with 1 pill daily AFTER eating, then increase to twice a day if no nausea Patient not taking: Reported on 10/23/2022 09/05/21   Georgina Quint, MD    No Known Allergies  Patient Active Problem List   Diagnosis Date Noted   Current smoker 03/20/2022   Chronic pain of both knees 06/27/2021   Essential hypertension 04/18/2021    Peripheral vascular disease (HCC) 04/18/2021   Erectile dysfunction 04/18/2021   Cigarette nicotine dependence without complication 04/18/2021    Past Medical History:  Diagnosis Date   Hypertension     Past Surgical History:  Procedure Laterality Date   ABDOMINAL AORTOGRAM W/LOWER EXTREMITY N/A 01/08/2017   Procedure: ABDOMINAL AORTOGRAM W/LOWER EXTREMITY;  Surgeon: Chuck Hint, MD;  Location: [facility] INVASIVE CV LAB;  Service: Cardiovascular;  Laterality: N/A;   PERIPHERAL VASCULAR BALLOON ANGIOPLASTY Left 01/08/2017   Procedure: PERIPHERAL VASCULAR BALLOON ANGIOPLASTY UNSUCCESSFUL;  Surgeon: Chuck Hint, MD;  Location: [facility] INVASIVE CV LAB;  Service: Cardiovascular;  Laterality: Left;    Social History   Socioeconomic History   Marital status: Married    Spouse name: Not on file   Number of children: Not on file   Years of education: Not on file   Highest education level: Not on file  Occupational History    Comment: [employer]  Tobacco Use   Smoking status: Every Day    Current packs/day: 0.50    Types: Cigarettes   Smokeless tobacco: Never  Vaping Use   Vaping status: Never Used  Substance and Sexual Activity   Alcohol use: Yes   Drug use: Not on file   Sexual activity: Not on file  Other Topics Concern   Not on file  Social History Narrative   pt reports working for [employer] for over 30 years   Social Determinants of Health  Financial Resource Strain: Not on file  Food Insecurity: Not on file  Transportation Needs: Not on file  Physical Activity: Not on file  Stress: Not on file  Social Connections: Not on file  Intimate Partner Violence: Not on file    No family history on file.   Review of Systems  Constitutional: Negative.  Negative for fever.  HENT: Negative.  Negative for congestion and sore throat.   Respiratory: Negative.  Negative for cough and shortness of breath.   Cardiovascular: Negative.  Negative for  chest pain and palpitations.  Gastrointestinal:  Negative for abdominal pain, diarrhea, nausea and vomiting.  Genitourinary: Negative.  Negative for dysuria and hematuria.  Musculoskeletal:  Positive for joint pain (knees).  Skin: Negative.  Negative for rash.  Neurological: Negative.  Negative for dizziness and headaches.  All other systems reviewed and are negative.   Vitals:   10/23/22 0904  BP: 122/88  Pulse: (!) 52  Temp: 98 F (36.7 C)  SpO2: 98%    Physical Exam Vitals reviewed.  Constitutional:      Appearance: Normal appearance.  HENT:     Head: Normocephalic.     Right Ear: Tympanic membrane, ear canal and external ear normal.     Left Ear: Tympanic membrane, ear canal and external ear normal.     Mouth/Throat:     Mouth: Mucous membranes are moist.     Pharynx: Oropharynx is clear.  Eyes:     Extraocular Movements: Extraocular movements intact.     Conjunctiva/sclera: Conjunctivae normal.     Pupils: Pupils are equal, round, and reactive to light.  Cardiovascular:     Rate and Rhythm: Normal rate and regular rhythm.     Pulses: Normal pulses.     Heart sounds: Normal heart sounds.  Pulmonary:     Effort: Pulmonary effort is normal.     Breath sounds: Normal breath sounds.  Musculoskeletal:     Cervical back: No tenderness.  Lymphadenopathy:     Cervical: No cervical adenopathy.  Skin:    General: Skin is warm and dry.     Capillary Refill: Capillary refill takes less than 2 seconds.  Neurological:     General: No focal deficit present.     Mental Status: He is alert and oriented to person, place, and time.  Psychiatric:        Mood and Affect: Mood normal.        Behavior: Behavior normal.      ASSESSMENT & PLAN: Problem List Items Addressed This Visit       Cardiovascular and Mediastinum   Essential hypertension    Well-controlled blood pressure with normal numbers in the office Continue amlodipine 10 mg daily Cardiovascular risks  associated with hypertension discussed Dietary approaches to stop hypertension discussed      Relevant Medications   tadalafil (CIALIS) 20 MG tablet     Other   Erectile dysfunction    States Viagra not working as well. Recommend to try Cialis 20 mg daily New prescription sent to pharmacy of record today      Relevant Medications   tadalafil (CIALIS) 20 MG tablet   Chronic pain of both knees    Still active and affecting quality of life Pain management discussed. May need orthopedic referral in the near future      Current smoker    Still actively smoking Cardiovascular/cancer risk associated with smoking discussed. Smoking cessation advice given      Other Visit Diagnoses  Encounter for general adult medical examination with abnormal findings    -  Primary   Relevant Orders   CBC with Differential   Comprehensive metabolic panel   Hemoglobin A1c   Lipid panel   PSA(Must document that pt has been informed of limitations of PSA testing.)   Screening for colon cancer       Relevant Orders   Ambulatory referral to Gastroenterology   Screening for prostate cancer       Relevant Orders   PSA(Must document that pt has been informed of limitations of PSA testing.)   Screening for deficiency anemia       Relevant Orders   CBC with Differential   Screening for lipoid disorders       Relevant Orders   Lipid panel   Screening for endocrine, metabolic and immunity disorder       Relevant Orders   Comprehensive metabolic panel   Hemoglobin A1c      Modifiable risk factors discussed with patient. Anticipatory guidance according to age provided. The following topics were also discussed: Social Determinants of Health Smoking and cardiovascular/cancer risk associated with smoking.  Smoking cessation advice given Diet and nutrition Benefits of exercise Cancer screening and need for colon cancer screening with colonoscopy Vaccinations review and  recommendations Cardiovascular risk assessment and need for blood work The 10-year ASCVD risk score (Arnett DK, et al., 2019) is: 21.1%   Values used to calculate the score:     Age: 32 years     Sex: Male     Is Non-Hispanic African American: Yes     Diabetic: No     Tobacco smoker: Yes     Systolic Blood Pressure: 122 mmHg     Is BP treated: Yes     HDL Cholesterol: 65.8 mg/dL     Total Cholesterol: 231 mg/dL Review of multiple chronic medical conditions, review of all medications. Mental health including depression and anxiety Fall and accident prevention  Patient Instructions  Health Maintenance, Male Adopting a healthy lifestyle and getting preventive care are important in promoting health and wellness. Ask your health care provider about: The right schedule for you to have regular tests and exams. Things you can do on your own to prevent diseases and keep yourself healthy. What should I know about diet, weight, and exercise? Eat a healthy diet  Eat a diet that includes plenty of vegetables, fruits, low-fat dairy products, and lean protein. Do not eat a lot of foods that are high in solid fats, added sugars, or sodium. Maintain a healthy weight Body mass index (BMI) is a measurement that can be used to identify possible weight problems. It estimates body fat based on height and weight. Your health care provider can help determine your BMI and help you achieve or maintain a healthy weight. Get regular exercise Get regular exercise. This is one of the most important things you can do for your health. Most adults should: Exercise for at least 150 minutes each week. The exercise should increase your heart rate and make you sweat (moderate-intensity exercise). Do strengthening exercises at least twice a week. This is in addition to the moderate-intensity exercise. Spend less time sitting. Even light physical activity can be beneficial. Watch cholesterol and blood lipids Have your  blood tested for lipids and cholesterol at 62 years of age, then have this test every 5 years. You may need to have your cholesterol levels checked more often if: Your lipid or cholesterol levels are high.  You are older than 62 years of age. You are at high risk for heart disease. What should I know about cancer screening? Many types of cancers can be detected early and may often be prevented. Depending on your health history and family history, you may need to have cancer screening at various ages. This may include screening for: Colorectal cancer. Prostate cancer. Skin cancer. Lung cancer. What should I know about heart disease, diabetes, and high blood pressure? Blood pressure and heart disease High blood pressure causes heart disease and increases the risk of stroke. This is more likely to develop in people who have high blood pressure readings or are overweight. Talk with your health care provider about your target blood pressure readings. Have your blood pressure checked: Every 3-5 years if you are 56-29 years of age. Every year if you are 39 years old or older. If you are between the ages of 20 and 66 and are a current or former smoker, ask your health care provider if you should have a one-time screening for abdominal aortic aneurysm (AAA). Diabetes Have regular diabetes screenings. This checks your fasting blood sugar level. Have the screening done: Once every three years after age 69 if you are at a normal weight and have a low risk for diabetes. More often and at a younger age if you are overweight or have a high risk for diabetes. What should I know about preventing infection? Hepatitis B If you have a higher risk for hepatitis B, you should be screened for this virus. Talk with your health care provider to find out if you are at risk for hepatitis B infection. Hepatitis C Blood testing is recommended for: Everyone born from [birth year] through 1965. Anyone with known risk factors  for hepatitis C. Sexually transmitted infections (STIs) You should be screened each year for STIs, including gonorrhea and chlamydia, if: You are sexually active and are younger than 62 years of age. You are older than 62 years of age and your health care provider tells you that you are at risk for this type of infection. Your sexual activity has changed since you were last screened, and you are at increased risk for chlamydia or gonorrhea. Ask your health care provider if you are at risk. Ask your health care provider about whether you are at high risk for HIV. Your health care provider may recommend a prescription medicine to help prevent HIV infection. If you choose to take medicine to prevent HIV, you should first get tested for HIV. You should then be tested every 3 months for as long as you are taking the medicine. Follow these instructions at home: Alcohol use Do not drink alcohol if your health care provider tells you not to drink. If you drink alcohol: Limit how much you have to 0-2 drinks a day. Know how much alcohol is in your drink. In the [place] one drink equals one 12 oz bottle of beer (355 mL), one 5 oz glass of wine (148 mL), or one 1 oz glass of hard liquor (44 mL). Lifestyle Do not use any products that contain nicotine or tobacco. These products include cigarettes, chewing tobacco, and vaping devices, such as e-cigarettes. If you need help quitting, ask your health care provider. Do not use street drugs. Do not share needles. Ask your health care provider for help if you need support or information about quitting drugs. General instructions Schedule regular health, dental, and eye exams. Stay current with your vaccines. Tell your health care  provider if: You often feel depressed. You have ever been abused or do not feel safe at home. Summary Adopting a healthy lifestyle and getting preventive care are important in promoting health and wellness. Follow your health care  provider's instructions about healthy diet, exercising, and getting tested or screened for diseases. Follow your health care provider's instructions on monitoring your cholesterol and blood pressure. This information is not intended to replace advice given to you by your health care provider. Make sure you discuss any questions you have with your health care provider. Document Revised: 07/19/2020 Document Reviewed: 07/19/2020 Elsevier Patient Education  2024 Elsevier Inc.     Edwina Barth, MD [facility]

## 2022-10-23 NOTE — Assessment & Plan Note (Signed)
Still active and affecting quality of life Pain management discussed. May need orthopedic referral in the near future

## 2022-11-02 ENCOUNTER — Encounter: Payer: Self-pay | Admitting: Gastroenterology

## 2022-11-15 ENCOUNTER — Telehealth: Payer: Self-pay

## 2022-11-15 NOTE — Telephone Encounter (Signed)
Spoke with patient about his upcoming colonoscopy and let him know that he was not due for a colonoscopy until 01/2024.  PV & colonoscopy cancelled.

## 2022-11-16 ENCOUNTER — Other Ambulatory Visit (HOSPITAL_COMMUNITY): Payer: Self-pay

## 2022-11-16 ENCOUNTER — Other Ambulatory Visit: Payer: Self-pay | Admitting: Emergency Medicine

## 2022-11-16 DIAGNOSIS — E785 Hyperlipidemia, unspecified: Secondary | ICD-10-CM

## 2022-11-16 MED ORDER — ROSUVASTATIN CALCIUM 10 MG PO TABS
10.0000 mg | ORAL_TABLET | Freq: Every day | ORAL | 3 refills | Status: DC
  Filled 2022-11-16: qty 90, 90d supply, fill #0
  Filled 2023-03-09: qty 90, 90d supply, fill #1
  Filled 2023-06-13: qty 90, 90d supply, fill #2
  Filled 2023-09-18: qty 90, 90d supply, fill #3

## 2022-11-17 ENCOUNTER — Other Ambulatory Visit (HOSPITAL_COMMUNITY): Payer: Self-pay

## 2022-12-04 ENCOUNTER — Encounter: Payer: Commercial Managed Care - PPO | Admitting: Gastroenterology

## 2023-01-08 ENCOUNTER — Ambulatory Visit: Payer: Commercial Managed Care - PPO | Admitting: Emergency Medicine

## 2023-01-08 ENCOUNTER — Encounter: Payer: Self-pay | Admitting: Emergency Medicine

## 2023-01-08 ENCOUNTER — Ambulatory Visit (INDEPENDENT_AMBULATORY_CARE_PROVIDER_SITE_OTHER): Payer: Commercial Managed Care - PPO

## 2023-01-08 VITALS — BP 128/88 | HR 74 | Temp 98.1°F | Ht 72.0 in | Wt 154.5 lb

## 2023-01-08 DIAGNOSIS — R29898 Other symptoms and signs involving the musculoskeletal system: Secondary | ICD-10-CM | POA: Diagnosis not present

## 2023-01-08 DIAGNOSIS — I10 Essential (primary) hypertension: Secondary | ICD-10-CM | POA: Diagnosis not present

## 2023-01-08 DIAGNOSIS — Z23 Encounter for immunization: Secondary | ICD-10-CM | POA: Diagnosis not present

## 2023-01-08 DIAGNOSIS — M25812 Other specified joint disorders, left shoulder: Secondary | ICD-10-CM | POA: Diagnosis not present

## 2023-01-08 DIAGNOSIS — F17200 Nicotine dependence, unspecified, uncomplicated: Secondary | ICD-10-CM

## 2023-01-08 DIAGNOSIS — N5201 Erectile dysfunction due to arterial insufficiency: Secondary | ICD-10-CM

## 2023-01-08 DIAGNOSIS — R222 Localized swelling, mass and lump, trunk: Secondary | ICD-10-CM | POA: Diagnosis not present

## 2023-01-08 NOTE — Assessment & Plan Note (Addendum)
Will get x-rays today Report reviewed  Recommend orthopedic evaluation Referral placed today

## 2023-01-08 NOTE — Assessment & Plan Note (Addendum)
Not responding to usual medications Recommend urology evaluation Referral placed today

## 2023-01-08 NOTE — Patient Instructions (Signed)
Health Maintenance, Male Adopting a healthy lifestyle and getting preventive care are important in promoting health and wellness. Ask your health care provider about: The right schedule for you to have regular tests and exams. Things you can do on your own to prevent diseases and keep yourself healthy. What should I know about diet, weight, and exercise? Eat a healthy diet  Eat a diet that includes plenty of vegetables, fruits, low-fat dairy products, and lean protein. Do not eat a lot of foods that are high in solid fats, added sugars, or sodium. Maintain a healthy weight Body mass index (BMI) is a measurement that can be used to identify possible weight problems. It estimates body fat based on height and weight. Your health care provider can help determine your BMI and help you achieve or maintain a healthy weight. Get regular exercise Get regular exercise. This is one of the most important things you can do for your health. Most adults should: Exercise for at least 150 minutes each week. The exercise should increase your heart rate and make you sweat (moderate-intensity exercise). Do strengthening exercises at least twice a week. This is in addition to the moderate-intensity exercise. Spend less time sitting. Even light physical activity can be beneficial. Watch cholesterol and blood lipids Have your blood tested for lipids and cholesterol at 62 years of age, then have this test every 5 years. You may need to have your cholesterol levels checked more often if: Your lipid or cholesterol levels are high. You are older than 62 years of age. You are at high risk for heart disease. What should I know about cancer screening? Many types of cancers can be detected early and may often be prevented. Depending on your health history and family history, you may need to have cancer screening at various ages. This may include screening for: Colorectal cancer. Prostate cancer. Skin cancer. Lung  cancer. What should I know about heart disease, diabetes, and high blood pressure? Blood pressure and heart disease High blood pressure causes heart disease and increases the risk of stroke. This is more likely to develop in people who have high blood pressure readings or are overweight. Talk with your health care provider about your target blood pressure readings. Have your blood pressure checked: Every 3-5 years if you are 18-39 years of age. Every year if you are 40 years old or older. If you are between the ages of 65 and 75 and are a current or former smoker, ask your health care provider if you should have a one-time screening for abdominal aortic aneurysm (AAA). Diabetes Have regular diabetes screenings. This checks your fasting blood sugar level. Have the screening done: Once every three years after age 45 if you are at a normal weight and have a low risk for diabetes. More often and at a younger age if you are overweight or have a high risk for diabetes. What should I know about preventing infection? Hepatitis B If you have a higher risk for hepatitis B, you should be screened for this virus. Talk with your health care provider to find out if you are at risk for hepatitis B infection. Hepatitis C Blood testing is recommended for: Everyone born from 1945 through 1965. Anyone with known risk factors for hepatitis C. Sexually transmitted infections (STIs) You should be screened each year for STIs, including gonorrhea and chlamydia, if: You are sexually active and are younger than 62 years of age. You are older than 62 years of age and your   health care provider tells you that you are at risk for this type of infection. Your sexual activity has changed since you were last screened, and you are at increased risk for chlamydia or gonorrhea. Ask your health care provider if you are at risk. Ask your health care provider about whether you are at high risk for HIV. Your health care provider  may recommend a prescription medicine to help prevent HIV infection. If you choose to take medicine to prevent HIV, you should first get tested for HIV. You should then be tested every 3 months for as long as you are taking the medicine. Follow these instructions at home: Alcohol use Do not drink alcohol if your health care provider tells you not to drink. If you drink alcohol: Limit how much you have to 0-2 drinks a day. Know how much alcohol is in your drink. In the [place] one drink equals one 12 oz bottle of beer (355 mL), one 5 oz glass of wine (148 mL), or one 1 oz glass of hard liquor (44 mL). Lifestyle Do not use any products that contain nicotine or tobacco. These products include cigarettes, chewing tobacco, and vaping devices, such as e-cigarettes. If you need help quitting, ask your health care provider. Do not use street drugs. Do not share needles. Ask your health care provider for help if you need support or information about quitting drugs. General instructions Schedule regular health, dental, and eye exams. Stay current with your vaccines. Tell your health care provider if: You often feel depressed. You have ever been abused or do not feel safe at home. Summary Adopting a healthy lifestyle and getting preventive care are important in promoting health and wellness. Follow your health care provider's instructions about healthy diet, exercising, and getting tested or screened for diseases. Follow your health care provider's instructions on monitoring your cholesterol and blood pressure. This information is not intended to replace advice given to you by your health care provider. Make sure you discuss any questions you have with your health care provider. Document Revised: 07/19/2020 Document Reviewed: 07/19/2020 Elsevier Patient Education  2024 Elsevier Inc.  

## 2023-01-08 NOTE — Assessment & Plan Note (Signed)
Contributing to erectile dysfunction Cardiovascular/cancer risk associated with smoking discussed Smoke cessation advice given

## 2023-01-08 NOTE — Progress Notes (Signed)
Walter Odom 62 y.o.   Chief Complaint  Patient presents with   Referral    Patient states he wants a referral to a specialist for erectile dysfunction.     HISTORY OF PRESENT ILLNESS: This is a 62 y.o. male complaining of erectile dysfunction not responding to medications.  Requesting referral Also complaining of growth to left sternoclavicular joint for the past 6 months.  Denies injury or infection in that area.  Slightly tender to touch. No other complaints or medical concerns today.  HPI   Prior to Admission medications   Medication Sig Start Date End Date Taking? Authorizing Provider  amLODipine (NORVASC) 10 MG tablet Take 1 tablet (10 mg total) by mouth daily. 09/21/22  Yes Nekeshia Lenhardt, Eilleen Kempf, MD  rosuvastatin (CRESTOR) 10 MG tablet Take 1 tablet (10 mg total) by mouth daily. 11/16/22  Yes Cerina Leary, Eilleen Kempf, MD  tadalafil (CIALIS) 20 MG tablet Take 0.5-1 tablets (10-20 mg total) by mouth every other day as needed for erectile dysfunction. 10/23/22  Yes Shloime Keilman, Eilleen Kempf, MD  meloxicam (MOBIC) 7.5 MG tablet Take 1 tablet (7.5 mg total) by mouth daily as needed for pain. Patient not taking: Reported on 10/23/2022 09/05/21   Georgina Quint, MD  methocarbamol (ROBAXIN) 500 MG tablet Take 1-2 tablets (500-1,000 mg total) by mouth 3 (three) times daily as needed for muscle spasms. Patient not taking: Reported on 10/23/2022 04/18/21   Dulce Sellar, NP  olmesartan-hydrochlorothiazide (BENICAR HCT) 40-12.5 MG tablet Take 1 tablet by mouth daily. Patient not taking: Reported on 10/23/2022    [provider]  varenicline (CHANTIX) 0.5 MG tablet Take 1 tablet (0.5 mg total) by mouth 2 (two) times daily. START with 1 pill daily AFTER eating, then increase to twice a day if no nausea Patient not taking: Reported on 10/23/2022 09/05/21   Georgina Quint, MD    No Known Allergies  Patient Active Problem List   Diagnosis Date Noted   Current smoker 03/20/2022    Chronic pain of both knees 06/27/2021   Essential hypertension 04/18/2021   Peripheral vascular disease (HCC) 04/18/2021   Erectile dysfunction 04/18/2021   Cigarette nicotine dependence without complication 04/18/2021    Past Medical History:  Diagnosis Date   Hypertension     Past Surgical History:  Procedure Laterality Date   ABDOMINAL AORTOGRAM W/LOWER EXTREMITY N/A 01/08/2017   Procedure: ABDOMINAL AORTOGRAM W/LOWER EXTREMITY;  Surgeon: Chuck Hint, MD;  Location: [facility] INVASIVE CV LAB;  Service: Cardiovascular;  Laterality: N/A;   PERIPHERAL VASCULAR BALLOON ANGIOPLASTY Left 01/08/2017   Procedure: PERIPHERAL VASCULAR BALLOON ANGIOPLASTY UNSUCCESSFUL;  Surgeon: Chuck Hint, MD;  Location: [facility] INVASIVE CV LAB;  Service: Cardiovascular;  Laterality: Left;    Social History   Socioeconomic History   Marital status: Married    Spouse name: Not on file   Number of children: Not on file   Years of education: Not on file   Highest education level: Not on file  Occupational History    Comment: [employer]  Tobacco Use   Smoking status: Every Day    Current packs/day: 0.50    Types: Cigarettes   Smokeless tobacco: Never  Vaping Use   Vaping status: Never Used  Substance and Sexual Activity   Alcohol use: Yes   Drug use: Not on file   Sexual activity: Not on file  Other Topics Concern   Not on file  Social History Narrative   pt reports working for [employer] for over  30 years   Social Determinants of Corporate investment banker Strain: Not on file  Food Insecurity: Not on file  Transportation Needs: Not on file  Physical Activity: Not on file  Stress: Not on file  Social Connections: Not on file  Intimate Partner Violence: Not on file    No family history on file.   Review of Systems  Constitutional: Negative.  Negative for chills and fever.  HENT: Negative.  Negative for congestion and sore throat.   Respiratory: Negative.   Negative for cough and shortness of breath.   Cardiovascular: Negative.  Negative for chest pain and palpitations.  Gastrointestinal:  Negative for abdominal pain, diarrhea, nausea and vomiting.  Genitourinary: Negative.  Negative for dysuria and hematuria.  Skin: Negative.  Negative for rash.  Neurological: Negative.  Negative for dizziness and headaches.  All other systems reviewed and are negative.   Vitals:   01/08/23 1027  BP: 128/88  Pulse: 74  Temp: 98.1 F (36.7 C)  SpO2: 96%    Physical Exam Vitals reviewed.  Constitutional:      Appearance: Normal appearance.  HENT:     Head: Normocephalic.  Eyes:     Extraocular Movements: Extraocular movements intact.  Cardiovascular:     Rate and Rhythm: Normal rate.  Pulmonary:     Effort: Pulmonary effort is normal.  Musculoskeletal:     Comments: Overgrowth/soft tissue growth left sternoclavicular joint.  No redness with minimal tenderness to deep palpation  Skin:    General: Skin is warm and dry.  Neurological:     Mental Status: He is alert and oriented to person, place, and time.  Psychiatric:        Mood and Affect: Mood normal.        Behavior: Behavior normal.      ASSESSMENT & PLAN: A total of 34 minutes was spent with the patient and counseling/coordination of care regarding preparing for this visit, review of most recent office visit notes, diagnosis of hypertension and cardiovascular risk associated with this condition, differential diagnosis of left sternoclavicular joint enlargement, need for x-rays, need for orthopedic evaluation, prognosis, documentation and need for follow-up.  Problem List Items Addressed This Visit       Cardiovascular and Mediastinum   Essential hypertension    BP Readings from Last 3 Encounters:  01/08/23 128/88  10/23/22 122/88  03/20/22 126/74  Well-controlled hypertension Continue amlodipine 10 mg daily Cardiovascular risks associated with hypertension discussed          Musculoskeletal and Integument   Enlargement of sternoclavicular joint, left - Primary    Will get x-rays today Report reviewed  Recommend orthopedic evaluation Referral placed today      Relevant Orders   DG Sternum   DG Chest 2 View   Ambulatory referral to Orthopedic Surgery     Other   Erectile dysfunction    Not responding to usual medications Recommend urology evaluation Referral placed today      Relevant Orders   Ambulatory referral to Urology   Current smoker    Contributing to erectile dysfunction Cardiovascular/cancer risk associated with smoking discussed Smoke cessation advice given      Other Visit Diagnoses     Need for vaccination       Relevant Orders   Flu vaccine trivalent PF, 6mos and older(Flulaval,Afluria,Fluarix,Fluzone) (Completed)      Patient Instructions  Health Maintenance, Male Adopting a healthy lifestyle and getting preventive care are important in promoting health and wellness.  Ask your health care provider about: The right schedule for you to have regular tests and exams. Things you can do on your own to prevent diseases and keep yourself healthy. What should I know about diet, weight, and exercise? Eat a healthy diet  Eat a diet that includes plenty of vegetables, fruits, low-fat dairy products, and lean protein. Do not eat a lot of foods that are high in solid fats, added sugars, or sodium. Maintain a healthy weight Body mass index (BMI) is a measurement that can be used to identify possible weight problems. It estimates body fat based on height and weight. Your health care provider can help determine your BMI and help you achieve or maintain a healthy weight. Get regular exercise Get regular exercise. This is one of the most important things you can do for your health. Most adults should: Exercise for at least 150 minutes each week. The exercise should increase your heart rate and make you sweat (moderate-intensity  exercise). Do strengthening exercises at least twice a week. This is in addition to the moderate-intensity exercise. Spend less time sitting. Even light physical activity can be beneficial. Watch cholesterol and blood lipids Have your blood tested for lipids and cholesterol at 62 years of age, then have this test every 5 years. You may need to have your cholesterol levels checked more often if: Your lipid or cholesterol levels are high. You are older than 62 years of age. You are at high risk for heart disease. What should I know about cancer screening? Many types of cancers can be detected early and may often be prevented. Depending on your health history and family history, you may need to have cancer screening at various ages. This may include screening for: Colorectal cancer. Prostate cancer. Skin cancer. Lung cancer. What should I know about heart disease, diabetes, and high blood pressure? Blood pressure and heart disease High blood pressure causes heart disease and increases the risk of stroke. This is more likely to develop in people who have high blood pressure readings or are overweight. Talk with your health care provider about your target blood pressure readings. Have your blood pressure checked: Every 3-5 years if you are 64-28 years of age. Every year if you are 47 years old or older. If you are between the ages of 60 and 36 and are a current or former smoker, ask your health care provider if you should have a one-time screening for abdominal aortic aneurysm (AAA). Diabetes Have regular diabetes screenings. This checks your fasting blood sugar level. Have the screening done: Once every three years after age 55 if you are at a normal weight and have a low risk for diabetes. More often and at a younger age if you are overweight or have a high risk for diabetes. What should I know about preventing infection? Hepatitis B If you have a higher risk for hepatitis B, you should be  screened for this virus. Talk with your health care provider to find out if you are at risk for hepatitis B infection. Hepatitis C Blood testing is recommended for: Everyone born from [birth year] through 1965. Anyone with known risk factors for hepatitis C. Sexually transmitted infections (STIs) You should be screened each year for STIs, including gonorrhea and chlamydia, if: You are sexually active and are younger than 62 years of age. You are older than 62 years of age and your health care provider tells you that you are at risk for this type of infection. Your sexual  activity has changed since you were last screened, and you are at increased risk for chlamydia or gonorrhea. Ask your health care provider if you are at risk. Ask your health care provider about whether you are at high risk for HIV. Your health care provider may recommend a prescription medicine to help prevent HIV infection. If you choose to take medicine to prevent HIV, you should first get tested for HIV. You should then be tested every 3 months for as long as you are taking the medicine. Follow these instructions at home: Alcohol use Do not drink alcohol if your health care provider tells you not to drink. If you drink alcohol: Limit how much you have to 0-2 drinks a day. Know how much alcohol is in your drink. In the [place] one drink equals one 12 oz bottle of beer (355 mL), one 5 oz glass of wine (148 mL), or one 1 oz glass of hard liquor (44 mL). Lifestyle Do not use any products that contain nicotine or tobacco. These products include cigarettes, chewing tobacco, and vaping devices, such as e-cigarettes. If you need help quitting, ask your health care provider. Do not use street drugs. Do not share needles. Ask your health care provider for help if you need support or information about quitting drugs. General instructions Schedule regular health, dental, and eye exams. Stay current with your vaccines. Tell your health care  provider if: You often feel depressed. You have ever been abused or do not feel safe at home. Summary Adopting a healthy lifestyle and getting preventive care are important in promoting health and wellness. Follow your health care provider's instructions about healthy diet, exercising, and getting tested or screened for diseases. Follow your health care provider's instructions on monitoring your cholesterol and blood pressure. This information is not intended to replace advice given to you by your health care provider. Make sure you discuss any questions you have with your health care provider. Document Revised: 07/19/2020 Document Reviewed: 07/19/2020 Elsevier Patient Education  2024 Elsevier Inc.      Edwina Barth, MD [facility]

## 2023-01-08 NOTE — Assessment & Plan Note (Signed)
BP Readings from Last 3 Encounters:  01/08/23 128/88  10/23/22 122/88  03/20/22 126/74  Well-controlled hypertension Continue amlodipine 10 mg daily Cardiovascular risks associated with hypertension discussed

## 2023-01-29 ENCOUNTER — Ambulatory Visit: Payer: Commercial Managed Care - PPO | Admitting: Orthopedic Surgery

## 2023-03-09 ENCOUNTER — Other Ambulatory Visit (HOSPITAL_COMMUNITY): Payer: Self-pay

## 2023-03-28 DIAGNOSIS — N528 Other male erectile dysfunction: Secondary | ICD-10-CM | POA: Diagnosis not present

## 2023-04-18 DIAGNOSIS — N528 Other male erectile dysfunction: Secondary | ICD-10-CM | POA: Diagnosis not present

## 2023-06-13 ENCOUNTER — Other Ambulatory Visit (HOSPITAL_COMMUNITY): Payer: Self-pay

## 2023-07-25 ENCOUNTER — Other Ambulatory Visit (HOSPITAL_COMMUNITY): Payer: Self-pay

## 2023-10-09 ENCOUNTER — Ambulatory Visit: Admitting: Emergency Medicine

## 2023-10-15 ENCOUNTER — Encounter: Payer: Self-pay | Admitting: Emergency Medicine

## 2023-10-15 ENCOUNTER — Ambulatory Visit (INDEPENDENT_AMBULATORY_CARE_PROVIDER_SITE_OTHER): Admitting: Emergency Medicine

## 2023-10-15 ENCOUNTER — Ambulatory Visit (INDEPENDENT_AMBULATORY_CARE_PROVIDER_SITE_OTHER)

## 2023-10-15 ENCOUNTER — Ambulatory Visit: Payer: Self-pay | Admitting: Emergency Medicine

## 2023-10-15 ENCOUNTER — Other Ambulatory Visit (HOSPITAL_COMMUNITY): Payer: Self-pay

## 2023-10-15 VITALS — BP 126/92 | HR 62 | Temp 98.1°F | Ht 72.0 in | Wt 156.6 lb

## 2023-10-15 DIAGNOSIS — F17200 Nicotine dependence, unspecified, uncomplicated: Secondary | ICD-10-CM | POA: Diagnosis not present

## 2023-10-15 DIAGNOSIS — I10 Essential (primary) hypertension: Secondary | ICD-10-CM | POA: Diagnosis not present

## 2023-10-15 DIAGNOSIS — M25522 Pain in left elbow: Secondary | ICD-10-CM | POA: Diagnosis not present

## 2023-10-15 DIAGNOSIS — M7022 Olecranon bursitis, left elbow: Secondary | ICD-10-CM

## 2023-10-15 DIAGNOSIS — M19022 Primary osteoarthritis, left elbow: Secondary | ICD-10-CM | POA: Diagnosis not present

## 2023-10-15 DIAGNOSIS — I739 Peripheral vascular disease, unspecified: Secondary | ICD-10-CM

## 2023-10-15 DIAGNOSIS — M25422 Effusion, left elbow: Secondary | ICD-10-CM | POA: Diagnosis not present

## 2023-10-15 MED ORDER — MELOXICAM 15 MG PO TABS
15.0000 mg | ORAL_TABLET | Freq: Every day | ORAL | 0 refills | Status: AC
  Filled 2023-10-15: qty 10, 10d supply, fill #0

## 2023-10-15 NOTE — Patient Instructions (Signed)
Bursitis  Bursitis is when the fluid-filled sac (bursa) that covers and protects a joint is swollen (inflamed). Bursitis is most common near joints such as the knees, elbows, hips, and shoulders. It can cause pain and stiffness. What are the causes? An injury to a joint area. Repeated use of a joint. Infection. Certain conditions that cause swelling. What increases the risk? Putting stress on a joint over and over again. Having a condition that weakens your body's defense system (immune system). Doing any of these often: Lifting and reaching overhead. Kneeling or leaning on hard surfaces. Doing activities that have a motion that you do over and over again. This includes running and walking. What are the signs or symptoms? Common symptoms of this condition include: Pain that gets worse when you move the affected body part or use it to support your body weight. Irritation and swelling (inflammation). Stiffness. Other symptoms include: Redness. Swelling. Tenderness. Warmth. Pain that stays after rest. Fever or chills if there is an infection. How is this treated? This condition can often be treated at home with: Rest. Ice. Wrapping the area with an elastic bandage (compression). Keeping the affected area raised (elevation). Other treatments may include: Medicine for pain and swelling. Shots of medicine to the area to lessen swelling. Draining fluid out of the bursa. Antibiotic medicine for infection. Using a splint, brace, wrap, pads, or walking aid. Therapy if pain continues or you have limited movement. Surgery. Follow these instructions at home: Medicines Take over-the-counter and prescription medicines only as told by your doctor. If you were prescribed an antibiotic medicine, take it as told by your doctor. Do not stop taking it even if you start to feel better. Managing pain, stiffness, and swelling     Raise the injured area above the level of your heart while you  are sitting or lying down. If told, put ice on the affected area. To do this: Put ice in a plastic bag. Place a towel between your skin and the bag, or between your splint or brace and the bag. Leave the ice on for 20 minutes, 2-3 times a day. Take off the ice if your skin turns bright red. This is very important. If you cannot feel pain, heat, or cold, you have a greater risk of damage to the area. If told, put heat on the affected area. Do this as often as told by your doctor. Use the heat source that your doctor recommends, such as a moist heat pack or a heating pad. Place a towel between your skin and the heat source. Leave the heat on for 20-30 minutes. Take off the heat if your skin turns bright red. This is very important. If you cannot feel pain, heat, or cold, you have a greater risk of getting burned. General instructions Rest the affected area as told by your doctor. Avoid doing things that make the pain worse. Use a splint, brace, pad, wrap, or walking aid as told by your doctor. Keep all follow-up visits. Preventing symptoms Wear knee pads if you kneel often. Wear running or walking shoes that fit you well. Take a lot of breaks during activities that involve doing the same movements again and again. Before you do any activity that takes a lot of effort, get your body ready by stretching. Stay at a healthy weight or lose weight if your doctor says you should. If you need help doing this, ask your doctor. Exercise often. If you start any new physical activity, do it  slowly. Work with your physical or occupational therapist and doctor to find what caused the bursitis. Contact a doctor if: You have a fever or chills. You have symptoms that do not get better with treatment. You have pain or swelling that: Gets worse. Goes away and then comes back. You have pus coming from the affected area. You have redness around the affected area. The affected area is warm to the  touch. Summary Bursitis is when the fluid-filled sac (bursa) that covers and protects a joint is swollen. Rest the affected area as told by your doctor. Avoid doing things that make the pain worse. Put ice on the affected area as told by your doctor. This information is not intended to replace advice given to you by your health care provider. Make sure you discuss any questions you have with your health care provider. Document Revised: 02/22/2021 Document Reviewed: 02/22/2021 Elsevier Patient Education  2024 [employer].

## 2023-10-15 NOTE — Assessment & Plan Note (Signed)
 Contributing to erectile dysfunction Cardiovascular/cancer risk associated with smoking discussed Smoke cessation advice given

## 2023-10-15 NOTE — Assessment & Plan Note (Addendum)
 Slowly improving. Secondary to activities at work putting pressure on elbow Recommend x-ray today.  Mild arthritis on x-ray as well as small joint effusion. Recommend meloxicam  15 mg daily for 7 to 10 days

## 2023-10-15 NOTE — Assessment & Plan Note (Signed)
 BP Readings from Last 3 Encounters:  10/15/23 (!) 126/92  01/08/23 128/88  10/23/22 122/88  Well-controlled hypertension Continue amlodipine  10 mg daily Cardiovascular risks associated with hypertension discussed

## 2023-10-15 NOTE — Assessment & Plan Note (Signed)
 Pain management discussed Recommend meloxicam  15 mg daily for 7 to 10 days May also use Tylenol as needed

## 2023-10-15 NOTE — Assessment & Plan Note (Signed)
 Stable.  No concerns.

## 2023-10-15 NOTE — Progress Notes (Signed)
 Walter Odom 63 y.o.   Chief Complaint  Patient presents with   Joint Swelling    Left elbow. Patient states that the swelling has gone down but it's still painful. Noticed it about 2-3 weeks ago     HISTORY OF PRESENT ILLNESS: This is a 63 y.o. male complaining of left elbow pain with swelling that started a couple weeks ago. Also here for follow-up of hypertension Still smoking.  HPI   Prior to Admission medications   Medication Sig Start Date End Date Taking? Authorizing Provider  amLODipine  (NORVASC ) 10 MG tablet Take 1 tablet (10 mg total) by mouth daily. 09/21/22  Yes Nadra Hritz, Emil Schanz, MD  rosuvastatin  (CRESTOR ) 10 MG tablet Take 1 tablet (10 mg total) by mouth daily. 11/16/22  Yes Isla Sabree, Emil Schanz, MD  tadalafil  (CIALIS ) 20 MG tablet Take 0.5-1 tablets (10-20 mg total) by mouth every other day as needed for erectile dysfunction. 10/23/22  Yes Kamaryn Grimley Jose, MD  olmesartan-hydrochlorothiazide (BENICAR HCT) 40-12.5 MG tablet Take 1 tablet by mouth daily. Patient not taking: Reported on 10/23/2022    [provider]    No Known Allergies  Patient Active Problem List   Diagnosis Date Noted   Enlargement of sternoclavicular joint, left 01/08/2023   Current smoker 03/20/2022   Chronic pain of both knees 06/27/2021   Essential hypertension 04/18/2021   Peripheral vascular disease (HCC) 04/18/2021   Erectile dysfunction 04/18/2021   Cigarette nicotine dependence without complication 04/18/2021    Past Medical History:  Diagnosis Date   Hypertension     Past Surgical History:  Procedure Laterality Date   ABDOMINAL AORTOGRAM W/LOWER EXTREMITY N/A 01/08/2017   Procedure: ABDOMINAL AORTOGRAM W/LOWER EXTREMITY;  Surgeon: Eliza Lonni RAMAN, MD;  Location: [facility] INVASIVE CV LAB;  Service: Cardiovascular;  Laterality: N/A;   PERIPHERAL VASCULAR BALLOON ANGIOPLASTY Left 01/08/2017   Procedure: PERIPHERAL VASCULAR BALLOON ANGIOPLASTY UNSUCCESSFUL;   Surgeon: Eliza Lonni RAMAN, MD;  Location: [facility] INVASIVE CV LAB;  Service: Cardiovascular;  Laterality: Left;    Social History   Socioeconomic History   Marital status: Married    Spouse name: Not on file   Number of children: Not on file   Years of education: Not on file   Highest education level: Not on file  Occupational History    Comment: [employer]  Tobacco Use   Smoking status: Every Day    Current packs/day: 0.50    Types: Cigarettes   Smokeless tobacco: Never  Vaping Use   Vaping status: Never Used  Substance and Sexual Activity   Alcohol use: Yes   Drug use: Not on file   Sexual activity: Not on file  Other Topics Concern   Not on file  Social History Narrative   pt reports working for [employer] for over 30 years   Social Drivers of Corporate investment banker Strain: Not on file  Food Insecurity: Not on file  Transportation Needs: Not on file  Physical Activity: Not on file  Stress: Not on file  Social Connections: Not on file  Intimate Partner Violence: Not on file    No family history on file.   Review of Systems  Constitutional: Negative.  Negative for chills and fever.  HENT: Negative.  Negative for congestion and sore throat.   Respiratory: Negative.  Negative for cough and shortness of breath.   Cardiovascular: Negative.  Negative for chest pain and palpitations.  Gastrointestinal:  Negative for abdominal pain, diarrhea, nausea and vomiting.  Genitourinary:  Negative.   Musculoskeletal:  Positive for joint pain.  Skin: Negative.  Negative for rash.  Neurological: Negative.  Negative for dizziness and headaches.  All other systems reviewed and are negative.   Today's Vitals   10/15/23 0825  BP: (!) 126/92  Pulse: 62  Temp: 98.1 F (36.7 C)  TempSrc: Oral  SpO2: 99%  Weight: 156 lb 9.6 oz (71 kg)  Height: 6' (1.829 m)   Body mass index is 21.24 kg/m.   Physical Exam Vitals reviewed.  Constitutional:       Appearance: Normal appearance.  HENT:     Head: Normocephalic.  Eyes:     Extraocular Movements: Extraocular movements intact.  Cardiovascular:     Rate and Rhythm: Normal rate.  Pulmonary:     Effort: Pulmonary effort is normal.  Musculoskeletal:     Comments: Left elbow: Mild swelling and tenderness.  Full range of motion. Rest of left upper extremity within normal limits.  Skin:    General: Skin is warm and dry.  Neurological:     Mental Status: He is alert and oriented to person, place, and time.  Psychiatric:        Behavior: Behavior normal.    DG Elbow 2 Views Left Result Date: 10/15/2023 CLINICAL DATA:  Left elbow pain for 3 weeks.  No known injury. EXAM: LEFT ELBOW - 2 VIEW COMPARISON:  None Available. FINDINGS: No acute fracture or dislocation. No aggressive osseous lesion. Mild degenerative arthritis of elbow joint. No radiopaque foreign bodies. There is small elbow joint effusion. Soft tissues are otherwise within normal limits. IMPRESSION: No acute osseous abnormality of the left elbow joint. Mild degenerative arthritis. Small elbow joint effusion. Electronically Signed   By: Ree Molt M.D.   On: 10/15/2023 08:57     ASSESSMENT & PLAN: A total of 42 minutes was spent with the patient and counseling/coordination of care regarding preparing for this visit, review of most recent office visit notes, review of multiple chronic medical conditions and their management, review of all medications, review of today's x-ray images, management of bursitis, review of most recent bloodwork results, review of health maintenance items, education on nutrition, prognosis, documentation, and need for follow up.  Problem List Items Addressed This Visit       Cardiovascular and Mediastinum   Essential hypertension   BP Readings from Last 3 Encounters:  10/15/23 (!) 126/92  01/08/23 128/88  10/23/22 122/88  Well-controlled hypertension Continue amlodipine  10 mg daily Cardiovascular  risks associated with hypertension discussed         Peripheral vascular disease (HCC)   Stable.  No concerns.        Musculoskeletal and Integument   Olecranon bursitis of left elbow - Primary   Slowly improving. Secondary to activities at work putting pressure on elbow Recommend x-ray today.  Mild arthritis on x-ray as well as small joint effusion. Recommend meloxicam  15 mg daily for 7 to 10 days      Relevant Medications   meloxicam  (MOBIC ) 15 MG tablet   Other Relevant Orders   DG Elbow 2 Views Left (Completed)     Other   Current smoker   Contributing to erectile dysfunction Cardiovascular/cancer risk associated with smoking discussed Smoke cessation advice given      Left elbow pain   Pain management discussed Recommend meloxicam  15 mg daily for 7 to 10 days May also use Tylenol as needed        Patient Instructions  Bursitis  Bursitis  is when the fluid-filled sac (bursa) that covers and protects a joint is swollen (inflamed). Bursitis is most common near joints such as the knees, elbows, hips, and shoulders. It can cause pain and stiffness. What are the causes? An injury to a joint area. Repeated use of a joint. Infection. Certain conditions that cause swelling. What increases the risk? Putting stress on a joint over and over again. Having a condition that weakens your body's defense system (immune system). Doing any of these often: Lifting and reaching overhead. Kneeling or leaning on hard surfaces. Doing activities that have a motion that you do over and over again. This includes running and walking. What are the signs or symptoms? Common symptoms of this condition include: Pain that gets worse when you move the affected body part or use it to support your body weight. Irritation and swelling (inflammation). Stiffness. Other symptoms include: Redness. Swelling. Tenderness. Warmth. Pain that stays after rest. Fever or chills if there is an  infection. How is this treated? This condition can often be treated at home with: Rest. Ice. Wrapping the area with an elastic bandage (compression). Keeping the affected area raised (elevation). Other treatments may include: Medicine for pain and swelling. Shots of medicine to the area to lessen swelling. Draining fluid out of the bursa. Antibiotic medicine for infection. Using a splint, brace, wrap, pads, or walking aid. Therapy if pain continues or you have limited movement. Surgery. Follow these instructions at home: Medicines Take over-the-counter and prescription medicines only as told by your doctor. If you were prescribed an antibiotic medicine, take it as told by your doctor. Do not stop taking it even if you start to feel better. Managing pain, stiffness, and swelling     Raise the injured area above the level of your heart while you are sitting or lying down. If told, put ice on the affected area. To do this: Put ice in a plastic bag. Place a towel between your skin and the bag, or between your splint or brace and the bag. Leave the ice on for 20 minutes, 2-3 times a day. Take off the ice if your skin turns bright red. This is very important. If you cannot feel pain, heat, or cold, you have a greater risk of damage to the area. If told, put heat on the affected area. Do this as often as told by your doctor. Use the heat source that your doctor recommends, such as a moist heat pack or a heating pad. Place a towel between your skin and the heat source. Leave the heat on for 20-30 minutes. Take off the heat if your skin turns bright red. This is very important. If you cannot feel pain, heat, or cold, you have a greater risk of getting burned. General instructions Rest the affected area as told by your doctor. Avoid doing things that make the pain worse. Use a splint, brace, pad, wrap, or walking aid as told by your doctor. Keep all follow-up visits. Preventing  symptoms Wear knee pads if you kneel often. Wear running or walking shoes that fit you well. Take a lot of breaks during activities that involve doing the same movements again and again. Before you do any activity that takes a lot of effort, get your body ready by stretching. Stay at a healthy weight or lose weight if your doctor says you should. If you need help doing this, ask your doctor. Exercise often. If you start any new physical activity, do it slowly. Work  with your physical or occupational therapist and doctor to find what caused the bursitis. Contact a doctor if: You have a fever or chills. You have symptoms that do not get better with treatment. You have pain or swelling that: Gets worse. Goes away and then comes back. You have pus coming from the affected area. You have redness around the affected area. The affected area is warm to the touch. Summary Bursitis is when the fluid-filled sac (bursa) that covers and protects a joint is swollen. Rest the affected area as told by your doctor. Avoid doing things that make the pain worse. Put ice on the affected area as told by your doctor. This information is not intended to replace advice given to you by your health care provider. Make sure you discuss any questions you have with your health care provider. Document Revised: 02/22/2021 Document Reviewed: 02/22/2021 Elsevier Patient Education  2024 [employer]     Emil Schaumann, MD [facility]

## 2023-10-22 DIAGNOSIS — N528 Other male erectile dysfunction: Secondary | ICD-10-CM | POA: Diagnosis not present

## 2023-10-24 ENCOUNTER — Other Ambulatory Visit (HOSPITAL_COMMUNITY): Payer: Self-pay

## 2023-10-26 ENCOUNTER — Other Ambulatory Visit: Payer: Self-pay | Admitting: Emergency Medicine

## 2023-10-26 ENCOUNTER — Other Ambulatory Visit (HOSPITAL_COMMUNITY): Payer: Self-pay

## 2023-10-26 DIAGNOSIS — I10 Essential (primary) hypertension: Secondary | ICD-10-CM

## 2023-10-26 MED ORDER — AMLODIPINE BESYLATE 10 MG PO TABS
10.0000 mg | ORAL_TABLET | Freq: Every day | ORAL | 3 refills | Status: AC
  Filled 2023-10-26: qty 90, 90d supply, fill #0
  Filled 2024-02-05: qty 90, 90d supply, fill #1
  Filled 2024-04-02: qty 90, 90d supply, fill #2

## 2023-11-08 ENCOUNTER — Encounter: Payer: Self-pay | Admitting: Emergency Medicine

## 2023-11-08 ENCOUNTER — Ambulatory Visit (INDEPENDENT_AMBULATORY_CARE_PROVIDER_SITE_OTHER): Admitting: Emergency Medicine

## 2023-11-08 VITALS — BP 122/82 | HR 86 | Temp 97.7°F | Ht 72.0 in | Wt 152.0 lb

## 2023-11-08 DIAGNOSIS — Z130 Encounter for screening for diseases of the blood and blood-forming organs and certain disorders involving the immune mechanism: Secondary | ICD-10-CM | POA: Diagnosis not present

## 2023-11-08 DIAGNOSIS — Z13228 Encounter for screening for other metabolic disorders: Secondary | ICD-10-CM | POA: Diagnosis not present

## 2023-11-08 DIAGNOSIS — F17200 Nicotine dependence, unspecified, uncomplicated: Secondary | ICD-10-CM

## 2023-11-08 DIAGNOSIS — Z1211 Encounter for screening for malignant neoplasm of colon: Secondary | ICD-10-CM | POA: Diagnosis not present

## 2023-11-08 DIAGNOSIS — Z1329 Encounter for screening for other suspected endocrine disorder: Secondary | ICD-10-CM

## 2023-11-08 DIAGNOSIS — Z0001 Encounter for general adult medical examination with abnormal findings: Secondary | ICD-10-CM

## 2023-11-08 DIAGNOSIS — Z13220 Encounter for screening for lipoid disorders: Secondary | ICD-10-CM

## 2023-11-08 DIAGNOSIS — I739 Peripheral vascular disease, unspecified: Secondary | ICD-10-CM

## 2023-11-08 DIAGNOSIS — I10 Essential (primary) hypertension: Secondary | ICD-10-CM

## 2023-11-08 LAB — CBC WITH DIFFERENTIAL/PLATELET
Basophils Absolute: 0 K/uL (ref 0.0–0.1)
Basophils Relative: 0.6 % (ref 0.0–3.0)
Eosinophils Absolute: 0.2 K/uL (ref 0.0–0.7)
Eosinophils Relative: 2.3 % (ref 0.0–5.0)
HCT: 46 % (ref 39.0–52.0)
Hemoglobin: 15.1 g/dL (ref 13.0–17.0)
Lymphocytes Relative: 28.5 % (ref 12.0–46.0)
Lymphs Abs: 2.1 K/uL (ref 0.7–4.0)
MCHC: 32.8 g/dL (ref 30.0–36.0)
MCV: 92.7 fl (ref 78.0–100.0)
Monocytes Absolute: 1.2 K/uL — ABNORMAL HIGH (ref 0.1–1.0)
Monocytes Relative: 16 % — ABNORMAL HIGH (ref 3.0–12.0)
Neutro Abs: 3.9 K/uL (ref 1.4–7.7)
Neutrophils Relative %: 52.6 % (ref 43.0–77.0)
Platelets: 213 K/uL (ref 150.0–400.0)
RBC: 4.96 Mil/uL (ref 4.22–5.81)
RDW: 13.9 % (ref 11.5–15.5)
WBC: 7.4 K/uL (ref 4.0–10.5)

## 2023-11-08 LAB — LIPID PANEL
Cholesterol: 142 mg/dL (ref 0–200)
HDL: 54 mg/dL (ref >39.00)
LDL Cholesterol: 73 mg/dL (ref 0–99)
NonHDL: 87.85
Total CHOL/HDL Ratio: 3
Triglycerides: 74 mg/dL (ref 0.0–149.0)
VLDL: 14.8 mg/dL (ref 0.0–40.0)

## 2023-11-08 LAB — COMPREHENSIVE METABOLIC PANEL WITH GFR
ALT: 20 U/L (ref 0–53)
AST: 20 U/L (ref 0–37)
Albumin: 4.8 g/dL (ref 3.5–5.2)
Alkaline Phosphatase: 80 U/L (ref 39–117)
BUN: 12 mg/dL (ref 6–23)
CO2: 27 meq/L (ref 19–32)
Calcium: 9.7 mg/dL (ref 8.4–10.5)
Chloride: 99 meq/L (ref 96–112)
Creatinine, Ser: 0.94 mg/dL (ref 0.40–1.50)
GFR: 86.41 mL/min (ref >60.00)
Glucose, Bld: 80 mg/dL (ref 70–99)
Potassium: 5 meq/L (ref 3.5–5.1)
Sodium: 138 meq/L (ref 135–145)
Total Bilirubin: 0.4 mg/dL (ref 0.2–1.2)
Total Protein: 7.5 g/dL (ref 6.0–8.3)

## 2023-11-08 LAB — VITAMIN D 25 HYDROXY (VIT D DEFICIENCY, FRACTURES): VITD: 14.09 ng/mL — ABNORMAL LOW (ref 30.00–100.00)

## 2023-11-08 LAB — PSA: PSA: 0.48 ng/mL (ref 0.10–4.00)

## 2023-11-08 LAB — VITAMIN B12: Vitamin B-12: 343 pg/mL (ref 211–911)

## 2023-11-08 LAB — HEMOGLOBIN A1C: Hgb A1c MFr Bld: 6.1 % (ref 4.6–6.5)

## 2023-11-08 NOTE — Assessment & Plan Note (Signed)
 BP Readings from Last 3 Encounters:  11/08/23 122/82  10/15/23 (!) 126/92  01/08/23 128/88  Well controled hypertension Continue amlodipine  10 mg daily Cardiovascular risks associated with hypertension discussed Benefits of exercise discussed Dietary approaches to stop hypertension discussed

## 2023-11-08 NOTE — Progress Notes (Signed)
 Walter Odom 63 y.o.   Chief Complaint  Patient presents with   Annual Exam    Patient here for physical. Patient mentions that for the past couple of weeks he has been throwing up after he eats, says its not all the time. He also mentions he can't walk to long without his right leg goes numb and his calf tightens up     HISTORY OF PRESENT ILLNESS: This is a 63 y.o. male here for annual exam and follow-up on chronic medical conditions Reports nausea and vomiting mostly in the morning after breakfast Continues to smoke Has history of peripheral vascular disease with occasional cramping mostly right leg Hypertension well-controlled Wt Readings from Last 3 Encounters:  11/08/23 152 lb (68.9 kg)  10/15/23 156 lb 9.6 oz (71 kg)  01/08/23 154 lb 8 oz (70.1 kg)     HPI   Prior to Admission medications   Medication Sig Start Date End Date Taking? Authorizing Provider  amLODipine  (NORVASC ) 10 MG tablet Take 1 tablet (10 mg total) by mouth daily. 10/26/23  Yes Margrit Minner, Emil Schanz, MD  rosuvastatin  (CRESTOR ) 10 MG tablet Take 1 tablet (10 mg total) by mouth daily. 11/16/22  Yes Tremont Gavitt, Emil Schanz, MD  tadalafil  (CIALIS ) 20 MG tablet Take 0.5-1 tablets (10-20 mg total) by mouth every other day as needed for erectile dysfunction. Patient not taking: Reported on 11/08/2023 10/23/22   [place] Emil Schanz, MD    No Known Allergies  Patient Active Problem List   Diagnosis Date Noted   Current smoker 03/20/2022   Chronic pain of both knees 06/27/2021   Essential hypertension 04/18/2021   Peripheral vascular disease (HCC) 04/18/2021   Erectile dysfunction 04/18/2021   Cigarette nicotine dependence without complication 04/18/2021    Past Medical History:  Diagnosis Date   Hypertension     Past Surgical History:  Procedure Laterality Date   ABDOMINAL AORTOGRAM W/LOWER EXTREMITY N/A 01/08/2017   Procedure: ABDOMINAL AORTOGRAM W/LOWER EXTREMITY;  Surgeon: Eliza Lonni RAMAN, MD;   Location: [facility] INVASIVE CV LAB;  Service: Cardiovascular;  Laterality: N/A;   PERIPHERAL VASCULAR BALLOON ANGIOPLASTY Left 01/08/2017   Procedure: PERIPHERAL VASCULAR BALLOON ANGIOPLASTY UNSUCCESSFUL;  Surgeon: Eliza Lonni RAMAN, MD;  Location: [facility] INVASIVE CV LAB;  Service: Cardiovascular;  Laterality: Left;    Social History   Socioeconomic History   Marital status: Married    Spouse name: Not on file   Number of children: Not on file   Years of education: Not on file   Highest education level: Not on file  Occupational History    Comment: [employer]  Tobacco Use   Smoking status: Every Day    Current packs/day: 0.50    Types: Cigarettes   Smokeless tobacco: Never  Vaping Use   Vaping status: Never Used  Substance and Sexual Activity   Alcohol use: Yes   Drug use: Not on file   Sexual activity: Not on file  Other Topics Concern   Not on file  Social History Narrative   pt reports working for [employer] for over 30 years   Social Drivers of Corporate investment banker Strain: Not on file  Food Insecurity: Not on file  Transportation Needs: Not on file  Physical Activity: Not on file  Stress: Not on file  Social Connections: Not on file  Intimate Partner Violence: Not on file    No family history on file.   Review of Systems  Constitutional: Negative.  Negative for chills and fever.  HENT: Negative.  Negative for congestion and sore throat.   Respiratory: Negative.  Negative for cough and shortness of breath.   Cardiovascular:  Positive for claudication. Negative for chest pain and palpitations.  Gastrointestinal:  Positive for nausea and vomiting. Negative for abdominal pain, blood in stool and melena.       Nausea and vomiting after eating breakfast in the morning None during the day Unknown cause  Genitourinary: Negative.  Negative for dysuria and hematuria.  Musculoskeletal: Negative.  Negative for back pain.  Skin: Negative.  Negative for  rash.  Neurological: Negative.  Negative for dizziness and headaches.  All other systems reviewed and are negative.   Vitals:   11/08/23 0801  BP: 122/82  Pulse: 86  Temp: 97.7 F (36.5 C)  SpO2: 97%    Physical Exam Vitals reviewed.  Constitutional:      Appearance: Normal appearance.  HENT:     Head: Normocephalic.     Mouth/Throat:     Mouth: Mucous membranes are moist.     Pharynx: Oropharynx is clear.  Eyes:     Extraocular Movements: Extraocular movements intact.     Pupils: Pupils are equal, round, and reactive to light.  Cardiovascular:     Rate and Rhythm: Normal rate and regular rhythm.     Pulses: Normal pulses.     Heart sounds: Normal heart sounds.  Pulmonary:     Effort: Pulmonary effort is normal.     Breath sounds: Normal breath sounds.  Abdominal:     Palpations: Abdomen is soft.     Tenderness: There is no abdominal tenderness.  Musculoskeletal:     Cervical back: No tenderness.     Right lower leg: No edema.     Left lower leg: No edema.  Lymphadenopathy:     Cervical: No cervical adenopathy.  Skin:    General: Skin is warm and dry.     Capillary Refill: Capillary refill takes less than 2 seconds.  Neurological:     General: No focal deficit present.     Mental Status: He is alert and oriented to person, place, and time.  Psychiatric:        Mood and Affect: Mood normal.        Behavior: Behavior normal.      ASSESSMENT & PLAN: Problem List Items Addressed This Visit       Cardiovascular and Mediastinum   Essential hypertension - Primary   BP Readings from Last 3 Encounters:  11/08/23 122/82  10/15/23 (!) 126/92  01/08/23 128/88  Well controled hypertension Continue amlodipine  10 mg daily Cardiovascular risks associated with hypertension discussed Benefits of exercise discussed Dietary approaches to stop hypertension discussed       Relevant Orders   CBC with Differential/Platelet   Comprehensive metabolic panel with GFR    Hemoglobin A1c   Lipid panel   PSA   Vitamin B12   VITAMIN D  25 Hydroxy (Vit-D Deficiency, Fractures)   Peripheral vascular disease (HCC)   Gets occasional cramping in lower extremities Stable.  Vascular ultrasound from 2023 report reviewed      Relevant Orders   CBC with Differential/Platelet   Comprehensive metabolic panel with GFR   Hemoglobin A1c   Lipid panel   PSA   Vitamin B12   VITAMIN D  25 Hydroxy (Vit-D Deficiency, Fractures)     Other   Current smoker   Contributing to erectile dysfunction Cardiovascular/cancer risk associated with smoking discussed Smoke cessation advice given Recommend lung cancer  screening Referral placed today      Relevant Orders   CBC with Differential/Platelet   Comprehensive metabolic panel with GFR   Hemoglobin A1c   Lipid panel   PSA   Vitamin B12   VITAMIN D  25 Hydroxy (Vit-D Deficiency, Fractures)   Ambulatory Referral for Lung Cancer Scre   Other Visit Diagnoses       Screening for colon cancer       Relevant Orders   Ambulatory referral to Gastroenterology     Modifiable risk factors discussed with patient. Anticipatory guidance according to age provided. The following topics were also discussed: Social Determinants of Health Smoking and cardiovascular/cancer risk associated with it. Diet and nutrition Benefits of exercise Cancer screening and need for colon cancer screening with colonoscopy Vaccinations review and recommendations Cardiovascular risk assessment and need for blood work Mental health including depression and anxiety Fall and accident prevention  Patient Instructions  Health Maintenance, Male Adopting a healthy lifestyle and getting preventive care are important in promoting health and wellness. Ask your health care provider about: The right schedule for you to have regular tests and exams. Things you can do on your own to prevent diseases and keep yourself healthy. What should I know about diet,  weight, and exercise? Eat a healthy diet  Eat a diet that includes plenty of vegetables, fruits, low-fat dairy products, and lean protein. Do not eat a lot of foods that are high in solid fats, added sugars, or sodium. Maintain a healthy weight Body mass index (BMI) is a measurement that can be used to identify possible weight problems. It estimates body fat based on height and weight. Your health care provider can help determine your BMI and help you achieve or maintain a healthy weight. Get regular exercise Get regular exercise. This is one of the most important things you can do for your health. Most adults should: Exercise for at least 150 minutes each week. The exercise should increase your heart rate and make you sweat (moderate-intensity exercise). Do strengthening exercises at least twice a week. This is in addition to the moderate-intensity exercise. Spend less time sitting. Even light physical activity can be beneficial. Watch cholesterol and blood lipids Have your blood tested for lipids and cholesterol at 63 years of age, then have this test every 5 years. You may need to have your cholesterol levels checked more often if: Your lipid or cholesterol levels are high. You are older than 63 years of age. You are at high risk for heart disease. What should I know about cancer screening? Many types of cancers can be detected early and may often be prevented. Depending on your health history and family history, you may need to have cancer screening at various ages. This may include screening for: Colorectal cancer. Prostate cancer. Skin cancer. Lung cancer. What should I know about heart disease, diabetes, and high blood pressure? Blood pressure and heart disease High blood pressure causes heart disease and increases the risk of stroke. This is more likely to develop in people who have high blood pressure readings or are overweight. Talk with your health care provider about your  target blood pressure readings. Have your blood pressure checked: Every 3-5 years if you are 83-13 years of age. Every year if you are 70 years old or older. If you are between the ages of 69 and 45 and are a current or former smoker, ask your health care provider if you should have a one-time screening for abdominal aortic  aneurysm (AAA). Diabetes Have regular diabetes screenings. This checks your fasting blood sugar level. Have the screening done: Once every three years after age 91 if you are at a normal weight and have a low risk for diabetes. More often and at a younger age if you are overweight or have a high risk for diabetes. What should I know about preventing infection? Hepatitis B If you have a higher risk for hepatitis B, you should be screened for this virus. Talk with your health care provider to find out if you are at risk for hepatitis B infection. Hepatitis C Blood testing is recommended for: Everyone born from [birth year] through 1965. Anyone with known risk factors for hepatitis C. Sexually transmitted infections (STIs) You should be screened each year for STIs, including gonorrhea and chlamydia, if: You are sexually active and are younger than 63 years of age. You are older than 63 years of age and your health care provider tells you that you are at risk for this type of infection. Your sexual activity has changed since you were last screened, and you are at increased risk for chlamydia or gonorrhea. Ask your health care provider if you are at risk. Ask your health care provider about whether you are at high risk for HIV. Your health care provider may recommend a prescription medicine to help prevent HIV infection. If you choose to take medicine to prevent HIV, you should first get tested for HIV. You should then be tested every 3 months for as long as you are taking the medicine. Follow these instructions at home: Alcohol use Do not drink alcohol if your health care provider  tells you not to drink. If you drink alcohol: Limit how much you have to 0-2 drinks a day. Know how much alcohol is in your drink. In the [place] one drink equals one 12 oz bottle of beer (355 mL), one 5 oz glass of wine (148 mL), or one 1 oz glass of hard liquor (44 mL). Lifestyle Do not use any products that contain nicotine or tobacco. These products include cigarettes, chewing tobacco, and vaping devices, such as e-cigarettes. If you need help quitting, ask your health care provider. Do not use street drugs. Do not share needles. Ask your health care provider for help if you need support or information about quitting drugs. General instructions Schedule regular health, dental, and eye exams. Stay current with your vaccines. Tell your health care provider if: You often feel depressed. You have ever been abused or do not feel safe at home. Summary Adopting a healthy lifestyle and getting preventive care are important in promoting health and wellness. Follow your health care provider's instructions about healthy diet, exercising, and getting tested or screened for diseases. Follow your health care provider's instructions on monitoring your cholesterol and blood pressure. This information is not intended to replace advice given to you by your health care provider. Make sure you discuss any questions you have with your health care provider. Document Revised: 07/19/2020 Document Reviewed: 07/19/2020 Elsevier Patient Education  2024 Elsevier Inc.     Emil Schaumann, MD [facility]

## 2023-11-08 NOTE — Patient Instructions (Signed)
 Health Maintenance, Male  Adopting a healthy lifestyle and getting preventive care are important in promoting health and wellness. Ask your health care provider about:  The right schedule for you to have regular tests and exams.  Things you can do on your own to prevent diseases and keep yourself healthy.  What should I know about diet, weight, and exercise?  Eat a healthy diet    Eat a diet that includes plenty of vegetables, fruits, low-fat dairy products, and lean protein.  Do not eat a lot of foods that are high in solid fats, added sugars, or sodium.  Maintain a healthy weight  Body mass index (BMI) is a measurement that can be used to identify possible weight problems. It estimates body fat based on height and weight. Your health care provider can help determine your BMI and help you achieve or maintain a healthy weight.  Get regular exercise  Get regular exercise. This is one of the most important things you can do for your health. Most adults should:  Exercise for at least 150 minutes each week. The exercise should increase your heart rate and make you sweat (moderate-intensity exercise).  Do strengthening exercises at least twice a week. This is in addition to the moderate-intensity exercise.  Spend less time sitting. Even light physical activity can be beneficial.  Watch cholesterol and blood lipids  Have your blood tested for lipids and cholesterol at 63 years of age, then have this test every 5 years.  You may need to have your cholesterol levels checked more often if:  Your lipid or cholesterol levels are high.  You are older than 63 years of age.  You are at high risk for heart disease.  What should I know about cancer screening?  Many types of cancers can be detected early and may often be prevented. Depending on your health history and family history, you may need to have cancer screening at various ages. This may include screening for:  Colorectal cancer.  Prostate cancer.  Skin cancer.  Lung  cancer.  What should I know about heart disease, diabetes, and high blood pressure?  Blood pressure and heart disease  High blood pressure causes heart disease and increases the risk of stroke. This is more likely to develop in people who have high blood pressure readings or are overweight.  Talk with your health care provider about your target blood pressure readings.  Have your blood pressure checked:  Every 3-5 years if you are 63-52 years of age.  Every year if you are 3 years old or older.  If you are between the ages of 60 and 72 and are a current or former smoker, ask your health care provider if you should have a one-time screening for abdominal aortic aneurysm (AAA).  Diabetes  Have regular diabetes screenings. This checks your fasting blood sugar level. Have the screening done:  Once every three years after age 63 if you are at a normal weight and have a low risk for diabetes.  More often and at a younger age if you are overweight or have a high risk for diabetes.  What should I know about preventing infection?  Hepatitis B  If you have a higher risk for hepatitis B, you should be screened for this virus. Talk with your health care provider to find out if you are at risk for hepatitis B infection.  Hepatitis C  Blood testing is recommended for:  Everyone born from [birth year] through 1965.  Anyone  with known risk factors for hepatitis C.  Sexually transmitted infections (STIs)  You should be screened each year for STIs, including gonorrhea and chlamydia, if:  You are sexually active and are younger than 63 years of age.  You are older than 63 years of age and your health care provider tells you that you are at risk for this type of infection.  Your sexual activity has changed since you were last screened, and you are at increased risk for chlamydia or gonorrhea. Ask your health care provider if you are at risk.  Ask your health care provider about whether you are at high risk for HIV. Your health care provider  may recommend a prescription medicine to help prevent HIV infection. If you choose to take medicine to prevent HIV, you should first get tested for HIV. You should then be tested every 3 months for as long as you are taking the medicine.  Follow these instructions at home:  Alcohol use  Do not drink alcohol if your health care provider tells you not to drink.  If you drink alcohol:  Limit how much you have to 0-2 drinks a day.  Know how much alcohol is in your drink. In the [place] one drink equals one 12 oz bottle of beer (355 mL), one 5 oz glass of wine (148 mL), or one 1 oz glass of hard liquor (44 mL).  Lifestyle  Do not use any products that contain nicotine or tobacco. These products include cigarettes, chewing tobacco, and vaping devices, such as e-cigarettes. If you need help quitting, ask your health care provider.  Do not use street drugs.  Do not share needles.  Ask your health care provider for help if you need support or information about quitting drugs.  General instructions  Schedule regular health, dental, and eye exams.  Stay current with your vaccines.  Tell your health care provider if:  You often feel depressed.  You have ever been abused or do not feel safe at home.  Summary  Adopting a healthy lifestyle and getting preventive care are important in promoting health and wellness.  Follow your health care provider's instructions about healthy diet, exercising, and getting tested or screened for diseases.  Follow your health care provider's instructions on monitoring your cholesterol and blood pressure.  This information is not intended to replace advice given to you by your health care provider. Make sure you discuss any questions you have with your health care provider.  Document Revised: 07/19/2020 Document Reviewed: 07/19/2020  Elsevier Patient Education  2024 [employer].

## 2023-11-08 NOTE — Assessment & Plan Note (Signed)
 Contributing to erectile dysfunction Cardiovascular/cancer risk associated with smoking discussed Smoke cessation advice given Recommend lung cancer screening Referral placed today

## 2023-11-08 NOTE — Assessment & Plan Note (Addendum)
 Gets occasional cramping in lower extremities Intermittent claudication symptoms Vascular ultrasound from 2023 report reviewed Needs follow-up with vascular surgeon

## 2023-11-09 ENCOUNTER — Ambulatory Visit: Payer: Self-pay | Admitting: Emergency Medicine

## 2023-11-14 ENCOUNTER — Telehealth: Payer: Self-pay | Admitting: Acute Care

## 2023-11-14 DIAGNOSIS — Z122 Encounter for screening for malignant neoplasm of respiratory organs: Secondary | ICD-10-CM

## 2023-11-14 DIAGNOSIS — F17210 Nicotine dependence, cigarettes, uncomplicated: Secondary | ICD-10-CM

## 2023-11-14 DIAGNOSIS — Z87891 Personal history of nicotine dependence: Secondary | ICD-10-CM

## 2023-11-14 NOTE — Telephone Encounter (Signed)
 Lung Cancer Screening Narrative/Criteria Questionnaire (Cigarette Smokers Only- No Cigars/Pipes/vapes)   Walter Odom   SDMV:11/19/23 at 0945a/Katy                                           Nov 27, 1960              LDCT: 11/26/23 at 5pm/DWB    63 y.o.   Phone: [phone]  Lung Screening Narrative (confirm age 67-77 yrs Medicare / 50-80 yrs Private pay insurance)   Insurance information:[facility] [employer]   Referring Provider:Sagardia   This screening involves an initial phone call with a team member from our program. It is called a shared decision making visit. The initial meeting is required by insurance and Medicare to make sure you understand the program. This appointment takes about 15-20 minutes to complete. The CT scan will completed at a separate date/time. This scan takes about 5-10 minutes to complete and you may eat and drink before and after the scan.  Criteria questions for Lung Cancer Screening:   Are you a current or former smoker? Current Age began smoking: 13y   If you are a former smoker, what year did you quit smoking?NA   To calculate your smoking history, I need an accurate estimate of how many packs of cigarettes you smoked per day and for how many years. (Not just the number of PPD you are now smoking)   Years smoking 50 x Packs per day 2 = Pack years 100   (at least 20 pack yrs)   (Make sure they understand that we need to know how much they have smoked in the past, not just the number of PPD they are smoking now)  Do you have a personal history of cancer?  No    Do you have a family history of cancer? No  Are you coughing up blood?  No  Have you had unexplained weight loss of 15 lbs or more in the last 6 months? No  It looks like you meet all criteria.     Additional information: N/A

## 2023-11-19 ENCOUNTER — Encounter: Payer: Self-pay | Admitting: Adult Health

## 2023-11-19 ENCOUNTER — Ambulatory Visit (INDEPENDENT_AMBULATORY_CARE_PROVIDER_SITE_OTHER): Admitting: Adult Health

## 2023-11-19 DIAGNOSIS — F17210 Nicotine dependence, cigarettes, uncomplicated: Secondary | ICD-10-CM

## 2023-11-19 NOTE — Patient Instructions (Signed)

## 2023-11-19 NOTE — Progress Notes (Signed)
  Virtual Visit via Telephone Note  I connected with [name] , 11/19/23 9:44 AM by a telemedicine application and verified that I am speaking with the correct person using two identifiers.  Location: Patient: home Provider: home   I discussed the limitations of evaluation and management by telemedicine and the availability of in person appointments. The patient expressed understanding and agreed to proceed.   Shared Decision Making Visit Lung Cancer Screening Program [phone])   Eligibility: 63 y.o. Pack Years Smoking History Calculation = 100 pack years  (# packs/per year x # years smoked) Recent History of coughing up blood  no Unexplained weight loss? no ( >Than 15 pounds within the last 6 months ) Prior History Lung / other cancer no (Diagnosis within the last 5 years already requiring surveillance chest CT Scans). Smoking Status Current Smoker   Visit Components: Discussion included one or more decision making aids. YES Discussion included risk/benefits of screening. YES Discussion included potential follow up diagnostic testing for abnormal scans. YES Discussion included meaning and risk of over diagnosis. YES Discussion included meaning and risk of False Positives. YES Discussion included meaning of total radiation exposure. YES  Counseling Included: Importance of adherence to annual lung cancer LDCT screening. YES Impact of comorbidities on ability to participate in the program. YES Ability and willingness to under diagnostic treatment. YES  Smoking Cessation Counseling: Current Smokers:  Discussed importance of smoking cessation. yes Information about tobacco cessation classes and interventions provided to patient. yes Patient provided with ticket for LDCT Scan. yes Symptomatic Patient. NO Diagnosis Code: Tobacco Use Z72.0 Asymptomatic Patient yes  Counseling - 4 minutes of smoking cessation counseling (CT Chest Lung Cancer Screening Low Dose W/O CM)  [id number]  Z12.2-Screening of respiratory organs Z87.891-Personal history of nicotine dependence   Walter Odom 11/19/23

## 2023-11-26 ENCOUNTER — Ambulatory Visit (HOSPITAL_BASED_OUTPATIENT_CLINIC_OR_DEPARTMENT_OTHER)
Admission: RE | Admit: 2023-11-26 | Discharge: 2023-11-26 | Disposition: A | Discharge status: Home / Self Care | Source: Ambulatory Visit | Attending: Acute Care | Admitting: Acute Care

## 2023-11-26 DIAGNOSIS — F17210 Nicotine dependence, cigarettes, uncomplicated: Secondary | ICD-10-CM | POA: Insufficient documentation

## 2023-11-26 DIAGNOSIS — Z122 Encounter for screening for malignant neoplasm of respiratory organs: Secondary | ICD-10-CM | POA: Diagnosis not present

## 2023-11-26 DIAGNOSIS — Z87891 Personal history of nicotine dependence: Secondary | ICD-10-CM | POA: Diagnosis not present

## 2023-12-11 ENCOUNTER — Encounter: Payer: Self-pay | Admitting: Emergency Medicine

## 2023-12-11 ENCOUNTER — Other Ambulatory Visit: Payer: Self-pay | Admitting: Acute Care

## 2023-12-11 ENCOUNTER — Other Ambulatory Visit: Payer: Self-pay | Admitting: Emergency Medicine

## 2023-12-11 DIAGNOSIS — Z87891 Personal history of nicotine dependence: Secondary | ICD-10-CM

## 2023-12-11 DIAGNOSIS — I739 Peripheral vascular disease, unspecified: Secondary | ICD-10-CM

## 2023-12-11 DIAGNOSIS — I2510 Atherosclerotic heart disease of native coronary artery without angina pectoris: Secondary | ICD-10-CM

## 2023-12-11 DIAGNOSIS — Z122 Encounter for screening for malignant neoplasm of respiratory organs: Secondary | ICD-10-CM

## 2023-12-11 DIAGNOSIS — F17200 Nicotine dependence, unspecified, uncomplicated: Secondary | ICD-10-CM

## 2023-12-11 DIAGNOSIS — I10 Essential (primary) hypertension: Secondary | ICD-10-CM

## 2023-12-11 DIAGNOSIS — F17210 Nicotine dependence, cigarettes, uncomplicated: Secondary | ICD-10-CM

## 2023-12-13 ENCOUNTER — Other Ambulatory Visit: Payer: Self-pay | Admitting: *Deleted

## 2023-12-13 DIAGNOSIS — I739 Peripheral vascular disease, unspecified: Secondary | ICD-10-CM

## 2023-12-18 ENCOUNTER — Encounter: Payer: Self-pay | Admitting: Emergency Medicine

## 2023-12-18 ENCOUNTER — Other Ambulatory Visit (HOSPITAL_COMMUNITY): Payer: Self-pay

## 2023-12-18 ENCOUNTER — Other Ambulatory Visit: Payer: Self-pay | Admitting: Emergency Medicine

## 2023-12-18 ENCOUNTER — Ambulatory Visit: Admitting: Emergency Medicine

## 2023-12-18 VITALS — BP 138/90 | HR 81 | Temp 98.3°F | Ht 72.0 in | Wt 155.0 lb

## 2023-12-18 DIAGNOSIS — F17200 Nicotine dependence, unspecified, uncomplicated: Secondary | ICD-10-CM

## 2023-12-18 DIAGNOSIS — M549 Dorsalgia, unspecified: Secondary | ICD-10-CM | POA: Diagnosis not present

## 2023-12-18 DIAGNOSIS — I739 Peripheral vascular disease, unspecified: Secondary | ICD-10-CM

## 2023-12-18 DIAGNOSIS — I10 Essential (primary) hypertension: Secondary | ICD-10-CM

## 2023-12-18 DIAGNOSIS — E785 Hyperlipidemia, unspecified: Secondary | ICD-10-CM

## 2023-12-18 DIAGNOSIS — J439 Emphysema, unspecified: Secondary | ICD-10-CM | POA: Insufficient documentation

## 2023-12-18 MED ORDER — ROSUVASTATIN CALCIUM 10 MG PO TABS
10.0000 mg | ORAL_TABLET | Freq: Every day | ORAL | 3 refills | Status: AC
  Filled 2023-12-18: qty 90, 90d supply, fill #0
  Filled 2024-04-09: qty 90, 90d supply, fill #1

## 2023-12-18 MED ORDER — CYCLOBENZAPRINE HCL 10 MG PO TABS
10.0000 mg | ORAL_TABLET | Freq: Every day | ORAL | 0 refills | Status: AC
  Filled 2023-12-18: qty 30, 30d supply, fill #0

## 2023-12-18 MED ORDER — DICLOFENAC SODIUM 75 MG PO TBEC
75.0000 mg | DELAYED_RELEASE_TABLET | Freq: Two times a day (BID) | ORAL | 0 refills | Status: AC | PRN
  Filled 2023-12-18: qty 30, 15d supply, fill #0

## 2023-12-18 NOTE — Assessment & Plan Note (Signed)
 BP Readings from Last 3 Encounters:  12/18/23 (!) 138/90  11/08/23 122/82  10/15/23 (!) 126/92  Well controled hypertension Continue amlodipine  10 mg daily Cardiovascular risks associated with hypertension discussed Benefits of exercise discussed Dietary approaches to stop hypertension discussed

## 2023-12-18 NOTE — Patient Instructions (Signed)
 Muscle Pain, Adult Muscle pain, also called myalgia, is a condition in which a person has pain in one or more muscles in the body. The pain may be mild, moderate, or severe. It may feel sharp, achy, or burning. In most cases, the pain lasts only a short time and goes away on its own. It is normal to feel some muscle pain after you start a new exercise program. Muscles that have not been used a lot will be sore at first. What are the causes? You may have muscle pain when you use your muscles in a new or different way after not having used them for some time. Muscle pain can also be caused by overuse or by stretching a muscle beyond its normal length (muscle strain). You may be more likely to have muscle pain if you are not in shape. Other causes may include: Injury or bruising. Infectious diseases. These include diseases caused by viruses, such as the flu (influenza). Fibromyalgia. This is a long-term (chronic) condition that causes muscle tenderness, tiredness (fatigue), and headache. Autoimmune or rheumatologic diseases. These are conditions, such as lupus, that cause the body's defense system (immune system) to attack areas in the body. Certain medicines. These include ACE inhibitors and statins. What are the signs or symptoms? The main symptom is sore or painful muscles. Your muscles may be sore when you do activities and when you stretch. You may also have slight swelling. How is this diagnosed? Muscle pain is diagnosed with a physical exam. Your health care provider will ask questions about your pain and when it began. If you have not had muscle pain for very long, your provider may want to wait before doing much testing. If your muscle pain has lasted a long time, tests may be done right away. In some cases, you may need tests to rule out other conditions and diseases. How is this treated? Treatment for muscle pain depends on the cause. Home care is usually enough to relieve the pain. Your  provider may also prescribe NSAIDs, such as ibuprofen. Follow these instructions at home: Medicines Take over-the-counter and prescription medicines only as told by your provider. Ask your health care provider if the medicine prescribed to you requires you to avoid driving or using machinery. Managing pain, swelling, and discomfort     If told, put ice on the painful area for the first 2 days of soreness. Put ice in a plastic bag. Place a towel between your skin and the bag. Leave the ice on for 20 minutes, 2-3 times a day. If your skin turns bright red, remove the ice right away to prevent skin damage. The risk of damage is higher if you cannot feel pain, heat, or cold. For the first 2 days of muscle soreness, or if there is swelling: Do not soak in hot baths. Do not use a hot tub, steam room, sauna, heating pad, or other heat source. After 2-3 days, you may switch between putting ice and heat on the area. If told, apply heat to the affected area as often as told by your provider. Use the heat source that your recommends, such as a moist heat pack or a heating pad. Place a towel between your skin and the heat source. Leave the heat on for 20-30 minutes. If your skin turns bright red, remove the ice or heat right away to prevent skin damage. The risk of damage is higher if you cannot feel pain, heat, or cold. If you have an injury,  raise (elevate) the injured area above the level of your heart while you are sitting or lying down. Activity  If your muscle pain is caused by overuse: Slow down your activities until the pain goes away. Do regular, gentle exercises if you are not normally active. Warm up before you exercise. Stretch before and after you exercise. This can help lower the risk of muscle pain. Do not keep working out if the pain is severe. Severe pain could mean that you have injured a muscle. You may have to avoid lifting. Ask your provider how much you can safely lift. Return  to your normal activities as told by your provider. Ask your provider what activities are safe for you. General instructions Do not use any products that contain nicotine or tobacco. These products include cigarettes, chewing tobacco, and vaping devices, such as e-cigarettes. If you need help quitting, ask your provider. Contact a health care provider if: Your muscle pain gets worse, and medicines do not help. The muscle pain lasts longer than 3 days. You have a rash or fever. You have muscle pain after a tick bite. You have muscle pain when you work out, even though you are in good shape. You have redness, soreness, or swelling. You have muscle pain after you start a new medicine or change the dose of a medicine. Get help right away if: You have trouble breathing. You have trouble swallowing. You have muscle pain along with a stiff neck, fever, and vomiting. You have severe muscle weakness, or you cannot move part of your body. You are urinating less, or you have dark, bloody, or discolored urine. You have redness or swelling at the site of the muscle pain. These symptoms may be an emergency. Get help right away. Call 911. Do not wait to see if the symptoms will go away. Do not drive yourself to the hospital. This information is not intended to replace advice given to you by your health care provider. Make sure you discuss any questions you have with your health care provider. Document Revised: 10/07/2021 Document Reviewed: 10/07/2021 Elsevier Patient Education  2024 [employer].

## 2023-12-18 NOTE — Progress Notes (Signed)
 Walter Odom 63 y.o.   Chief Complaint  Patient presents with   Back Pain    Patient here for back pain. Patient states its on the left side starting at the left shoulder down to lower left back. Patient states it started at work while working.     HISTORY OF PRESENT ILLNESS: This is a 63 y.o. male complaining of left-sided back pain that started today while at work Sharp pain, hurts to move, no associated symptoms. No other complaints or medical concerns today.  Back Pain Pertinent negatives include no abdominal pain, chest pain, fever or headaches.     Prior to Admission medications   Medication Sig Start Date End Date Taking? Authorizing Provider  amLODipine  (NORVASC ) 10 MG tablet Take 1 tablet (10 mg total) by mouth daily. 10/26/23  Yes Terry Abila, Emil Schanz, MD  rosuvastatin  (CRESTOR ) 10 MG tablet Take 1 tablet (10 mg total) by mouth daily. 11/16/22  Yes Aryn Kops, Emil Schanz, MD  tadalafil  (CIALIS ) 20 MG tablet Take 0.5-1 tablets (10-20 mg total) by mouth every other day as needed for erectile dysfunction. Patient not taking: Reported on 12/18/2023 10/23/22   Purcell Emil Schanz, MD    No Known Allergies  Patient Active Problem List   Diagnosis Date Noted   Current smoker 03/20/2022   Chronic pain of both knees 06/27/2021   Essential hypertension 04/18/2021   Peripheral vascular disease 04/18/2021   Erectile dysfunction 04/18/2021   Cigarette nicotine dependence without complication 04/18/2021    Past Medical History:  Diagnosis Date   Hypertension     Past Surgical History:  Procedure Laterality Date   ABDOMINAL AORTOGRAM W/LOWER EXTREMITY N/A 01/08/2017   Procedure: ABDOMINAL AORTOGRAM W/LOWER EXTREMITY;  Surgeon: Eliza Lonni RAMAN, MD;  Location: [facility] INVASIVE CV LAB;  Service: Cardiovascular;  Laterality: N/A;   PERIPHERAL VASCULAR BALLOON ANGIOPLASTY Left 01/08/2017   Procedure: PERIPHERAL VASCULAR BALLOON ANGIOPLASTY UNSUCCESSFUL;  Surgeon: Eliza Lonni RAMAN, MD;  Location: [facility] INVASIVE CV LAB;  Service: Cardiovascular;  Laterality: Left;    Social History   Socioeconomic History   Marital status: Married    Spouse name: Not on file   Number of children: Not on file   Years of education: Not on file   Highest education level: Not on file  Occupational History    Comment: [employer]  Tobacco Use   Smoking status: Every Day    Current packs/day: 0.50    Types: Cigarettes   Smokeless tobacco: Never  Vaping Use   Vaping status: Never Used  Substance and Sexual Activity   Alcohol use: Yes   Drug use: Not on file   Sexual activity: Not on file  Other Topics Concern   Not on file  Social History Narrative   pt reports working for [employer] for over 30 years   Social Drivers of Corporate investment banker Strain: Not on file  Food Insecurity: Not on file  Transportation Needs: Not on file  Physical Activity: Not on file  Stress: Not on file  Social Connections: Not on file  Intimate Partner Violence: Not on file    History reviewed. No pertinent family history.   Review of Systems  Constitutional: Negative.  Negative for chills and fever.  HENT: Negative.  Negative for congestion and sore throat.   Respiratory: Negative.  Negative for cough and shortness of breath.   Cardiovascular: Negative.  Negative for chest pain and palpitations.  Gastrointestinal:  Negative for abdominal pain, diarrhea, nausea and vomiting.  Musculoskeletal:  Positive for back pain.  Skin: Negative.  Negative for rash.  Neurological: Negative.  Negative for dizziness and headaches.  All other systems reviewed and are negative.   Vitals:   12/18/23 1449  BP: (!) 138/90  Pulse: 81  Temp: 98.3 F (36.8 C)  SpO2: 97%    Physical Exam Vitals reviewed.  Constitutional:      Appearance: Normal appearance.  HENT:     Head: Normocephalic.  Eyes:     Extraocular Movements: Extraocular movements intact.   Cardiovascular:     Rate and Rhythm: Normal rate and regular rhythm.     Pulses: Normal pulses.     Heart sounds: Normal heart sounds.  Pulmonary:     Effort: Pulmonary effort is normal.     Breath sounds: Normal breath sounds.  Abdominal:     Palpations: Abdomen is soft.     Tenderness: There is no abdominal tenderness.  Musculoskeletal:     Comments: Tender left-sided upper to mid back muscles with some spasm  Skin:    General: Skin is warm and dry.  Neurological:     General: No focal deficit present.     Mental Status: He is alert and oriented to person, place, and time.  Psychiatric:        Mood and Affect: Mood normal.        Behavior: Behavior normal.      ASSESSMENT & PLAN: Problem List Items Addressed This Visit       Cardiovascular and Mediastinum   Essential hypertension   BP Readings from Last 3 Encounters:  12/18/23 (!) 138/90  11/08/23 122/82  10/15/23 (!) 126/92  Well controled hypertension Continue amlodipine  10 mg daily Cardiovascular risks associated with hypertension discussed Benefits of exercise discussed Dietary approaches to stop hypertension discussed       Peripheral vascular disease     Other   Current smoker   Cardiovascular/cancer risk associated with smoking discussed Smoke cessation advice given      Musculoskeletal back pain - Primary   Clinically stable.  No red flag signs or symptoms Benign physical examination. Recommend muscle relaxants at bedtime, Flexeril 10 mg Pain management discussed Recommend diclofenac 75 mg twice a day as needed Apply heat pad to affected area several times a day Avoid strenuous physical activity Advised to contact the office if no better or worse during the next several days.      Relevant Medications   cyclobenzaprine (FLEXERIL) 10 MG tablet   diclofenac (VOLTAREN) 75 MG EC tablet   Patient Instructions  Muscle Pain, Adult Muscle pain, also called myalgia, is a condition in which a  person has pain in one or more muscles in the body. The pain may be mild, moderate, or severe. It may feel sharp, achy, or burning. In most cases, the pain lasts only a short time and goes away on its own. It is normal to feel some muscle pain after you start a new exercise program. Muscles that have not been used a lot will be sore at first. What are the causes? You may have muscle pain when you use your muscles in a new or different way after not having used them for some time. Muscle pain can also be caused by overuse or by stretching a muscle beyond its normal length (muscle strain). You may be more likely to have muscle pain if you are not in shape. Other causes may include: Injury or bruising. Infectious diseases. These include diseases caused  by viruses, such as the flu (influenza). Fibromyalgia. This is a long-term (chronic) condition that causes muscle tenderness, tiredness (fatigue), and headache. Autoimmune or rheumatologic diseases. These are conditions, such as lupus, that cause the body's defense system (immune system) to attack areas in the body. Certain medicines. These include ACE inhibitors and statins. What are the signs or symptoms? The main symptom is sore or painful muscles. Your muscles may be sore when you do activities and when you stretch. You may also have slight swelling. How is this diagnosed? Muscle pain is diagnosed with a physical exam. Your health care provider will ask questions about your pain and when it began. If you have not had muscle pain for very long, your provider may want to wait before doing much testing. If your muscle pain has lasted a long time, tests may be done right away. In some cases, you may need tests to rule out other conditions and diseases. How is this treated? Treatment for muscle pain depends on the cause. Home care is usually enough to relieve the pain. Your provider may also prescribe NSAIDs, such as ibuprofen. Follow these instructions  at home: Medicines Take over-the-counter and prescription medicines only as told by your provider. Ask your health care provider if the medicine prescribed to you requires you to avoid driving or using machinery. Managing pain, swelling, and discomfort     If told, put ice on the painful area for the first 2 days of soreness. Put ice in a plastic bag. Place a towel between your skin and the bag. Leave the ice on for 20 minutes, 2-3 times a day. If your skin turns bright red, remove the ice right away to prevent skin damage. The risk of damage is higher if you cannot feel pain, heat, or cold. For the first 2 days of muscle soreness, or if there is swelling: Do not soak in hot baths. Do not use a hot tub, steam room, sauna, heating pad, or other heat source. After 2-3 days, you may switch between putting ice and heat on the area. If told, apply heat to the affected area as often as told by your provider. Use the heat source that your recommends, such as a moist heat pack or a heating pad. Place a towel between your skin and the heat source. Leave the heat on for 20-30 minutes. If your skin turns bright red, remove the ice or heat right away to prevent skin damage. The risk of damage is higher if you cannot feel pain, heat, or cold. If you have an injury, raise (elevate) the injured area above the level of your heart while you are sitting or lying down. Activity  If your muscle pain is caused by overuse: Slow down your activities until the pain goes away. Do regular, gentle exercises if you are not normally active. Warm up before you exercise. Stretch before and after you exercise. This can help lower the risk of muscle pain. Do not keep working out if the pain is severe. Severe pain could mean that you have injured a muscle. You may have to avoid lifting. Ask your provider how much you can safely lift. Return to your normal activities as told by your provider. Ask your provider what  activities are safe for you. General instructions Do not use any products that contain nicotine or tobacco. These products include cigarettes, chewing tobacco, and vaping devices, such as e-cigarettes. If you need help quitting, ask your provider. Contact a health care provider  if: Your muscle pain gets worse, and medicines do not help. The muscle pain lasts longer than 3 days. You have a rash or fever. You have muscle pain after a tick bite. You have muscle pain when you work out, even though you are in good shape. You have redness, soreness, or swelling. You have muscle pain after you start a new medicine or change the dose of a medicine. Get help right away if: You have trouble breathing. You have trouble swallowing. You have muscle pain along with a stiff neck, fever, and vomiting. You have severe muscle weakness, or you cannot move part of your body. You are urinating less, or you have dark, bloody, or discolored urine. You have redness or swelling at the site of the muscle pain. These symptoms may be an emergency. Get help right away. Call 911. Do not wait to see if the symptoms will go away. Do not drive yourself to the hospital. This information is not intended to replace advice given to you by your health care provider. Make sure you discuss any questions you have with your health care provider. Document Revised: 10/07/2021 Document Reviewed: 10/07/2021 Elsevier Patient Education  2024 [employer] Inc[employer]    Emil Schaumann, MD [facility]

## 2023-12-18 NOTE — Assessment & Plan Note (Signed)
 Cardiovascular/cancer risk associated with smoking discussed Smoke cessation advice given

## 2023-12-18 NOTE — Assessment & Plan Note (Signed)
 Clinically stable.  No red flag signs or symptoms Benign physical examination. Recommend muscle relaxants at bedtime, Flexeril 10 mg Pain management discussed Recommend diclofenac 75 mg twice a day as needed Apply heat pad to affected area several times a day Avoid strenuous physical activity Advised to contact the office if no better or worse during the next several days.

## 2023-12-26 NOTE — Progress Notes (Unsigned)
 HISTORY AND PHYSICAL     CC:  follow up. Requesting Provider:  Purcell Emil Schanz, *  HPI: This is a 63 y.o. male who is here today for follow up for PAD.  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 Eliza.  He was not able to get across the occlusion.   Pt was last seen 09/01/2021 and at that time, he was encouraged to stop smoking and stay as active as possible.  He recommended asa/statin and pt preferred to be seen back as needed.   The pt returns today for follow up.  ***  The pt is on a statin for cholesterol management.    The pt is not on an aspirin .    Other AC:  none The pt is on CCB for hypertension.  The pt is not on diabetic medication. Tobacco hx:  current  Pt does *** have family hx of AAA.  Past Medical History:  Diagnosis Date   Hypertension     Past Surgical History:  Procedure Laterality Date   ABDOMINAL AORTOGRAM W/LOWER EXTREMITY N/A 01/08/2017   Procedure: ABDOMINAL AORTOGRAM W/LOWER EXTREMITY;  Surgeon: Eliza Lonni RAMAN, MD;  Location: [facility] INVASIVE CV LAB;  Service: Cardiovascular;  Laterality: N/A;   PERIPHERAL VASCULAR BALLOON ANGIOPLASTY Left 01/08/2017   Procedure: PERIPHERAL VASCULAR BALLOON ANGIOPLASTY UNSUCCESSFUL;  Surgeon: Eliza Lonni RAMAN, MD;  Location: [facility] INVASIVE CV LAB;  Service: Cardiovascular;  Laterality: Left;    No Known Allergies  Current Outpatient Medications  Medication Sig Dispense Refill   amLODipine  (NORVASC ) 10 MG tablet Take 1 tablet (10 mg total) by mouth daily. 90 tablet 3   cyclobenzaprine (FLEXERIL) 10 MG tablet Take 1 tablet (10 mg total) by mouth at bedtime. 30 tablet 0   diclofenac (VOLTAREN) 75 MG EC tablet Take 1 tablet (75 mg total) by mouth 2 (two) times daily as needed. 30 tablet 0   rosuvastatin  (CRESTOR ) 10 MG tablet Take 1 tablet (10 mg total) by mouth daily. 90 tablet 3   tadalafil  (CIALIS ) 20 MG tablet Take 0.5-1 tablets  (10-20 mg total) by mouth every other day as needed for erectile dysfunction. (Patient not taking: Reported on 12/18/2023) 10 tablet 11   No current facility-administered medications for this visit.    No family history on file.  Social History   Socioeconomic History   Marital status: Married    Spouse name: Not on file   Number of children: Not on file   Years of education: Not on file   Highest education level: Not on file  Occupational History    Comment: [employer]  Tobacco Use   Smoking status: Every Day    Current packs/day: 0.50    Types: Cigarettes   Smokeless tobacco: Never  Vaping Use   Vaping status: Never Used  Substance and Sexual Activity   Alcohol use: Yes   Drug use: Not on file   Sexual activity: Not on file  Other Topics Concern   Not on file  Social History Narrative   pt reports working for [employer] for over 30 years   Social Drivers of Corporate investment banker Strain: Not on file  Food Insecurity: Not on file  Transportation Needs: Not on file  Physical Activity: Not on file  Stress: Not on file  Social Connections: Not on file  Intimate Partner Violence: Not on file     REVIEW OF SYSTEMS:  *** [  X] denotes positive finding, [ ]  denotes negative finding Cardiac  Comments:  Chest pain or chest pressure:    Shortness of breath upon exertion:    Short of breath when lying flat:    Irregular heart rhythm:        Vascular    Pain in calf, thigh, or hip brought on by ambulation:    Pain in feet at night that wakes you up from your sleep:     Blood clot in your veins:    Leg swelling:         Pulmonary    Oxygen at home:    Productive cough:     Wheezing:         Neurologic    Sudden weakness in arms or legs:     Sudden numbness in arms or legs:     Sudden onset of difficulty speaking or slurred speech:    Temporary loss of vision in one eye:     Problems with dizziness:         Gastrointestinal    Blood in  stool:     Vomited blood:         Genitourinary    Burning when urinating:     Blood in urine:        Psychiatric    Major depression:         Hematologic    Bleeding problems:    Problems with blood clotting too easily:        Skin    Rashes or ulcers:        Constitutional    Fever or chills:      PHYSICAL EXAMINATION:  ***  General:  WDWN in NAD; vital signs documented above Gait: Not observed HENT: WNL, normocephalic Pulmonary: normal non-labored breathing , without wheezing Cardiac: {Desc; regular/irreg:14544} HR, {With/Without:20273} carotid bruit*** Abdomen: soft, NT; aortic pulse is *** palpable Skin: {With/Without:20273} rashes Vascular Exam/Pulses:  Right Left  Radial {Exam; arterial pulse strength 0-4:30167} {Exam; arterial pulse strength 0-4:30167}  Femoral {Exam; arterial pulse strength 0-4:30167} {Exam; arterial pulse strength 0-4:30167}  Popliteal {Exam; arterial pulse strength 0-4:30167} {Exam; arterial pulse strength 0-4:30167}  DP {Exam; arterial pulse strength 0-4:30167} {Exam; arterial pulse strength 0-4:30167}  PT {Exam; arterial pulse strength 0-4:30167} {Exam; arterial pulse strength 0-4:30167}  Peroneal *** ***   Extremities: {With/Without:20273} ischemic changes, {With/Without:20273} Gangrene , {With/Without:20273} cellulitis; {With/Without:20273} open wounds Musculoskeletal: no muscle wasting or atrophy  Neurologic: A&O X 3 Psychiatric:  The pt has {Desc; normal/abnormal:11317::Normal} affect.   Non-Invasive Vascular Imaging:   ABI's/TBI's on 12/27/2023: Right:  *** - Great toe pressure: *** Left:  *** - Great toe pressure: ***  Previous ABI's/TBI's on 09/01/2021: Right:  0.92/0.68 - Great toe pressure: 96 Left:  0.79/0.55 - Great toe pressure:  78   ASSESSMENT/PLAN:: 63 y.o. male here for follow up for PAD.  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 Eliza.  He  was not able to get across the occlusion.   PAD -*** -continue statin/*** -discussed importance of increased walking daily -pt will f/u in *** with ***.  Smoker -***   Lucie Apt, [facility] [phone]  Clinic MD:   [place]

## 2023-12-27 ENCOUNTER — Ambulatory Visit: Admitting: Physician Assistant

## 2023-12-27 ENCOUNTER — Ambulatory Visit (HOSPITAL_COMMUNITY)
Admission: RE | Admit: 2023-12-27 | Discharge: 2023-12-27 | Disposition: A | Discharge status: Home / Self Care | Source: Ambulatory Visit | Attending: Vascular Surgery | Admitting: Vascular Surgery

## 2023-12-27 VITALS — BP 141/87 | HR 54 | Temp 97.9°F | Wt 160.8 lb

## 2023-12-27 DIAGNOSIS — I739 Peripheral vascular disease, unspecified: Secondary | ICD-10-CM

## 2023-12-27 LAB — VAS US ABI WITH/WO TBI
Left ABI: 0.68
Right ABI: 0.57

## 2023-12-31 ENCOUNTER — Other Ambulatory Visit: Payer: Self-pay

## 2023-12-31 ENCOUNTER — Encounter: Payer: Self-pay | Admitting: Pulmonary Disease

## 2023-12-31 ENCOUNTER — Ambulatory Visit (INDEPENDENT_AMBULATORY_CARE_PROVIDER_SITE_OTHER): Admitting: Pulmonary Disease

## 2023-12-31 VITALS — BP 114/84 | HR 70 | Temp 97.9°F | Ht 72.0 in | Wt 162.2 lb

## 2023-12-31 DIAGNOSIS — J449 Chronic obstructive pulmonary disease, unspecified: Secondary | ICD-10-CM | POA: Diagnosis not present

## 2023-12-31 DIAGNOSIS — R9389 Abnormal findings on diagnostic imaging of other specified body structures: Secondary | ICD-10-CM

## 2023-12-31 DIAGNOSIS — R0602 Shortness of breath: Secondary | ICD-10-CM

## 2023-12-31 DIAGNOSIS — I739 Peripheral vascular disease, unspecified: Secondary | ICD-10-CM

## 2023-12-31 NOTE — Patient Instructions (Signed)
 I will see you back in 6 to 8 weeks  We will get a breathing study  It is important to focus on and make sure you quit smoking-the sooner this is done the better for your lungs and your health overall.  If you change your mind about trying Chantix  or any other medication to help you quit smoking, Let us  know  If you would want to try an inhaler as well to help with the shortness of breath, we can put that in place  Continue graded activity as tolerated

## 2023-12-31 NOTE — Progress Notes (Signed)
 Walter Odom    [id number]    [birth year]  Primary Care Physician:Sagardia, Emil Schanz, MD  Referring Physician: Purcell Emil Schanz, MD [address] [place],  [place] [address]  Chief complaint:   Patient being seen for chest pain, shortness of breath, abnormal CT with emphysema  HPI:  2 pack-a-day smoker Was able to quit in the past and then went back to smoking  He does get short of breath with moderate exertion Tries to stay active  Denies any cough Has some chest discomfort Not associated with activity, not worse with activity, no known triggers to him   He is not coughing up any secretions  No shortness of breath at rest  No history of heart disease known to him  No pertinent occupational history  Outpatient Encounter Medications as of 12/31/2023  Medication Sig   amLODipine  (NORVASC ) 10 MG tablet Take 1 tablet (10 mg total) by mouth daily.   cyclobenzaprine (FLEXERIL) 10 MG tablet Take 1 tablet (10 mg total) by mouth at bedtime.   diclofenac (VOLTAREN) 75 MG EC tablet Take 1 tablet (75 mg total) by mouth 2 (two) times daily as needed.   rosuvastatin  (CRESTOR ) 10 MG tablet Take 1 tablet (10 mg total) by mouth daily.   [DISCONTINUED] tadalafil  (CIALIS ) 20 MG tablet Take 0.5-1 tablets (10-20 mg total) by mouth every other day as needed for erectile dysfunction. (Patient not taking: Reported on 12/18/2023)   No facility-administered encounter medications on file as of 12/31/2023.    Allergies as of 12/31/2023   (No Known Allergies)    Past Medical History:  Diagnosis Date   Hypertension     Past Surgical History:  Procedure Laterality Date   ABDOMINAL AORTOGRAM W/LOWER EXTREMITY N/A 01/08/2017   Procedure: ABDOMINAL AORTOGRAM W/LOWER EXTREMITY;  Surgeon: Eliza Lonni RAMAN, MD;  Location: [facility] INVASIVE CV LAB;  Service: Cardiovascular;  Laterality: N/A;   PERIPHERAL VASCULAR BALLOON ANGIOPLASTY Left 01/08/2017   Procedure: PERIPHERAL  VASCULAR BALLOON ANGIOPLASTY UNSUCCESSFUL;  Surgeon: Eliza Lonni RAMAN, MD;  Location: [facility] INVASIVE CV LAB;  Service: Cardiovascular;  Laterality: Left;    No family history on file.  Social History   Socioeconomic History   Marital status: Married    Spouse name: Not on file   Number of children: Not on file   Years of education: Not on file   Highest education level: Not on file  Occupational History    Comment: [employer]  Tobacco Use   Smoking status: Every Day    Current packs/day: 0.50    Types: Cigarettes   Smokeless tobacco: Never  Vaping Use   Vaping status: Never Used  Substance and Sexual Activity   Alcohol use: Yes   Drug use: Not on file   Sexual activity: Not on file  Other Topics Concern   Not on file  Social History Narrative   pt reports working for [employer] for over 30 years   Social Drivers of Corporate investment banker Strain: Not on file  Food Insecurity: Not on file  Transportation Needs: Not on file  Physical Activity: Not on file  Stress: Not on file  Social Connections: Not on file  Intimate Partner Violence: Not on file    Review of Systems  Respiratory:  Positive for shortness of breath.     Vitals:   12/31/23 0933  BP: 114/84  Pulse: 70  Temp: 97.9 F (36.6 C)  SpO2: 97%  Physical Exam Constitutional:      Appearance: Normal appearance.  HENT:     Head: Normocephalic.     Mouth/Throat:     Mouth: Mucous membranes are moist.  Eyes:     General: No scleral icterus. Cardiovascular:     Rate and Rhythm: Normal rate and regular rhythm.     Heart sounds: No murmur heard.    No friction rub.  Pulmonary:     Effort: No respiratory distress.     Breath sounds: No stridor. No wheezing or rhonchi.  Musculoskeletal:     Cervical back: No rigidity or tenderness.  Neurological:     Mental Status: He is alert.  Psychiatric:        Mood and Affect: Mood normal.    Data Reviewed: CT scan was reviewed  with the patient   Assessment/Plan: Abnormal CT scan of the chest with emphysema, right upper lobe small nodule  Chest discomfort  Shortness of breath on exertion  Patient has obstructive lung disease with symptoms at present - Discussed inhaler therapy  Active smoker - Extensively discussed smoking cessation, counseling provided - Patient does not want to use any medications to help at present - Feels that he can quit smoking himself  He does need follow-up CT scan of the chest-low-dose CT  Will schedule him for a pulmonary function test to assess severity of his obstructive lung disease  Importance of smoking cessation reiterated  I did discuss if significant shortness of breath, we may need to obtain an ultrasound of his heart to assess cardiac functions  Follow-up in about 6 to 8 weeks  Jennet Epley MD [facility] 12/31/2023, 9:54 AM  CC: Purcell Emil Schanz, *

## 2024-01-14 ENCOUNTER — Encounter: Payer: Self-pay | Admitting: Radiology

## 2024-02-05 ENCOUNTER — Encounter: Payer: Self-pay | Admitting: Internal Medicine

## 2024-02-05 ENCOUNTER — Ambulatory Visit (INDEPENDENT_AMBULATORY_CARE_PROVIDER_SITE_OTHER): Admitting: *Deleted

## 2024-02-05 DIAGNOSIS — J449 Chronic obstructive pulmonary disease, unspecified: Secondary | ICD-10-CM

## 2024-02-05 DIAGNOSIS — R0602 Shortness of breath: Secondary | ICD-10-CM

## 2024-02-05 DIAGNOSIS — R9389 Abnormal findings on diagnostic imaging of other specified body structures: Secondary | ICD-10-CM

## 2024-02-05 LAB — PULMONARY FUNCTION TEST
DL/VA % pred: 63 %
DL/VA: 2.61 ml/min/mmHg/L
DLCO cor % pred: 62 %
DLCO cor: 17.92 ml/min/mmHg
DLCO unc % pred: 62 %
DLCO unc: 17.97 ml/min/mmHg
FEF 25-75 Post: 1.4 L/s
FEF 25-75 Pre: 2.48 L/s
FEF2575-%Change-Post: -43 %
FEF2575-%Pred-Post: 46 %
FEF2575-%Pred-Pre: 82 %
FEV1-%Change-Post: -8 %
FEV1-%Pred-Post: 79 %
FEV1-%Pred-Pre: 87 %
FEV1-Post: 2.99 L
FEV1-Pre: 3.28 L
FEV1FVC-%Change-Post: 0 %
FEV1FVC-%Pred-Pre: 95 %
FEV6-%Change-Post: -3 %
FEV6-%Pred-Post: 86 %
FEV6-%Pred-Pre: 90 %
FEV6-Post: 4.16 L
FEV6-Pre: 4.31 L
FEV6FVC-%Change-Post: 2 %
FEV6FVC-%Pred-Post: 104 %
FEV6FVC-%Pred-Pre: 102 %
FVC-%Change-Post: -9 %
FVC-%Pred-Post: 83 %
FVC-%Pred-Pre: 91 %
FVC-Post: 4.16 L
FVC-Pre: 4.59 L
Post FEV1/FVC ratio: 72 %
Post FEV6/FVC ratio: 100 %
Pre FEV1/FVC ratio: 72 %
Pre FEV6/FVC Ratio: 97 %
RV % pred: 114 %
RV: 2.75 L
TLC % pred: 106 %
TLC: 7.86 L

## 2024-02-05 NOTE — Patient Instructions (Signed)
 Full PFT performed today.

## 2024-02-05 NOTE — Progress Notes (Signed)
 Full PFT performed today.

## 2024-02-06 ENCOUNTER — Other Ambulatory Visit (HOSPITAL_COMMUNITY): Payer: Self-pay

## 2024-02-25 ENCOUNTER — Other Ambulatory Visit (HOSPITAL_COMMUNITY): Payer: Self-pay

## 2024-02-25 ENCOUNTER — Ambulatory Visit

## 2024-02-25 VITALS — Ht 72.0 in | Wt 156.0 lb

## 2024-02-25 DIAGNOSIS — Z1211 Encounter for screening for malignant neoplasm of colon: Secondary | ICD-10-CM

## 2024-02-25 MED ORDER — NA SULFATE-K SULFATE-MG SULF 17.5-3.13-1.6 GM/177ML PO SOLN
1.0000 | Freq: Once | ORAL | 0 refills | Status: AC
  Filled 2024-02-25 – 2024-03-12 (×2): qty 354, 1d supply, fill #0

## 2024-02-25 NOTE — Progress Notes (Signed)

## 2024-02-27 ENCOUNTER — Other Ambulatory Visit: Payer: Self-pay

## 2024-02-27 DIAGNOSIS — I739 Peripheral vascular disease, unspecified: Secondary | ICD-10-CM

## 2024-03-05 ENCOUNTER — Other Ambulatory Visit (HOSPITAL_COMMUNITY): Payer: Self-pay

## 2024-03-12 ENCOUNTER — Telehealth: Payer: Self-pay | Admitting: Gastroenterology

## 2024-03-12 ENCOUNTER — Other Ambulatory Visit (HOSPITAL_COMMUNITY): Payer: Self-pay

## 2024-03-12 NOTE — Telephone Encounter (Signed)
 Patient's wife called answering service asking if patient could have radish or onion as a part of potato salad today  Discussed with patient's wife that a detailed instruction list has been sent via MyChart for her to review (see telephone note from earlier today) and recommended patient avoid raw vegetables including raw onion ad radish and leading up to colonoscopy. Can have cooked vegetables. Encouraged to call back with any further questions.

## 2024-03-14 ENCOUNTER — Encounter: Payer: Self-pay | Admitting: Internal Medicine

## 2024-03-17 ENCOUNTER — Ambulatory Visit: Admitting: Internal Medicine

## 2024-03-17 ENCOUNTER — Encounter: Payer: Self-pay | Admitting: Internal Medicine

## 2024-03-17 VITALS — BP 143/75 | HR 53 | Temp 97.1°F | Resp 19 | Ht 72.0 in | Wt 156.0 lb

## 2024-03-17 DIAGNOSIS — Z1211 Encounter for screening for malignant neoplasm of colon: Secondary | ICD-10-CM | POA: Diagnosis present

## 2024-03-17 MED ORDER — SODIUM CHLORIDE 0.9 % IV SOLN: 500.0000 mL | Freq: Once | INTRAVENOUS | Status: DC

## 2024-03-17 NOTE — Progress Notes (Signed)
 HISTORY OF PRESENT ILLNESS:  Walter Odom is a 64 y.o. male sent directly for screening colonoscopy.  No complaints  REVIEW OF SYSTEMS:  All non-GI ROS negative except for  Past Medical History:  Diagnosis Date   Hyperlipidemia    Hypertension     Past Surgical History:  Procedure Laterality Date   ABDOMINAL AORTOGRAM W/LOWER EXTREMITY N/A 01/08/2017   Procedure: ABDOMINAL AORTOGRAM W/LOWER EXTREMITY;  Surgeon: Eliza Lonni RAMAN, MD;  Location: [facility] INVASIVE CV LAB;  Service: Cardiovascular;  Laterality: N/A;   PERIPHERAL VASCULAR BALLOON ANGIOPLASTY Left 01/08/2017   Procedure: PERIPHERAL VASCULAR BALLOON ANGIOPLASTY UNSUCCESSFUL;  Surgeon: Eliza Lonni RAMAN, MD;  Location: [facility] INVASIVE CV LAB;  Service: Cardiovascular;  Laterality: Left;    Social History Walter Odom  reports that he has been smoking cigarettes. He has never used smokeless tobacco. He reports current alcohol use. He reports current drug use. Drug: Marijuana.  family history is not on file.  Allergies[1]     PHYSICAL EXAMINATION: Vital signs: BP 126/79   Pulse 79   Temp (!) 97.1 F (36.2 C) (Temporal)   Ht 6' (1.829 m)   Wt 156 lb (70.8 kg)   SpO2 97%   BMI 21.16 kg/m  General: Well-developed, well-nourished, no acute distress HEENT: Sclerae are anicteric, conjunctiva pink. Oral mucosa intact Lungs: Clear Heart: Regular Abdomen: soft, nontender, nondistended, no obvious ascites, no peritoneal signs, normal bowel sounds. No organomegaly. Extremities: No edema Psychiatric: alert and oriented x3. Cooperative     ASSESSMENT: Colon cancer screening    PLAN: Screening colonoscopy          [1] No Known Allergies

## 2024-03-17 NOTE — Progress Notes (Signed)
 Report to PACU, RN, vss, BBS= Clear.

## 2024-03-17 NOTE — Patient Instructions (Signed)

## 2024-03-17 NOTE — Progress Notes (Signed)
 Pt's states no medical or surgical changes since previsit or office visit.

## 2024-03-17 NOTE — Op Note (Signed)
 [facility] Patient Name: Walter Odom Procedure Date: 03/17/2024 9:02 AM MRN: [id number] Endoscopist: Norleen SAILOR. Abran , MD, [id number] Age: 64 Referring MD:  Date of Birth: Jun 07, 1960 Gender: Male Account #: [id number] Procedure:                Colonoscopy Indications:              Screening for colorectal malignant neoplasm.                            Reports remote colonoscopic exam in [facility]                            Note details Medicines:                Monitored Anesthesia Care Procedure:                Pre-Anesthesia Assessment:                           - Prior to the procedure, a History and Physical                            was performed, and patient medications and                            allergies were reviewed. The patient's tolerance of                            previous anesthesia was also reviewed. The risks                            and benefits of the procedure and the sedation                            options and risks were discussed with the patient.                            All questions were answered, and informed consent                            was obtained. Prior Anticoagulants: The patient has                            taken no anticoagulant or antiplatelet agents. ASA                            Grade Assessment: II - A patient with mild systemic                            disease. After reviewing the risks and benefits,                            the patient was deemed in satisfactory condition to  undergo the procedure.                           After obtaining informed consent, the colonoscope                            was passed under direct vision. Throughout the                            procedure, the patient's blood pressure, pulse, and                            oxygen saturations were monitored continuously. The                            Olympus Scope SN: [id number] was introduced through                             the anus and advanced to the the cecum, identified                            by appendiceal orifice and ileocecal valve. The                            ileocecal valve, appendiceal orifice, and rectum                            were photographed. The quality of the bowel                            preparation was excellent. The colonoscopy was                            performed without difficulty. The patient tolerated                            the procedure well. The bowel preparation used was                            SUPREP via split dose instruction. Scope In: 9:12:13 AM Scope Out: 9:22:22 AM Scope Withdrawal Time: 0 hours 8 minutes 7 seconds  Total Procedure Duration: 0 hours 10 minutes 9 seconds  Findings:                 The entire examined colon appeared normal on direct                            and retroflexion views. Complications:            No immediate complications. Estimated blood loss:                            None. Estimated Blood Loss:     Estimated blood loss: none. Impression:               - The entire examined  colon is normal on direct and                            retroflexion views.                           - No specimens collected. Recommendation:           - Repeat colonoscopy in 10 years for screening                            purposes.                           - Patient has a contact number available for                            emergencies. The signs and symptoms of potential                            delayed complications were discussed with the                            patient. Return to normal activities tomorrow.                            Written discharge instructions were provided to the                            patient.                           - Resume previous diet.                           - Continue present medications. Norleen SAILOR. Abran, MD 03/17/2024 9:26:24 AM This report has been signed electronically.

## 2024-03-18 ENCOUNTER — Telehealth: Payer: Self-pay

## 2024-03-18 NOTE — Telephone Encounter (Signed)
 Post procedure follow up call, no answer

## 2024-03-20 ENCOUNTER — Encounter: Payer: Self-pay | Admitting: Pulmonary Disease

## 2024-03-20 ENCOUNTER — Ambulatory Visit: Admitting: Pulmonary Disease

## 2024-03-27 ENCOUNTER — Ambulatory Visit

## 2024-03-27 ENCOUNTER — Ambulatory Visit (HOSPITAL_COMMUNITY)
Admission: RE | Admit: 2024-03-27 | Discharge: 2024-03-27 | Disposition: A | Discharge status: Home / Self Care | Source: Ambulatory Visit | Attending: Surgery | Admitting: Surgery

## 2024-03-27 DIAGNOSIS — I739 Peripheral vascular disease, unspecified: Secondary | ICD-10-CM | POA: Diagnosis not present

## 2024-03-27 LAB — VAS US ABI WITH/WO TBI
Left ABI: 0.67
Right ABI: 0.59

## 2024-04-03 ENCOUNTER — Other Ambulatory Visit: Payer: Self-pay

## 2024-04-16 ENCOUNTER — Ambulatory Visit: Admitting: Physician Assistant

## 2024-04-16 VITALS — BP 123/80 | HR 67 | Ht 72.0 in | Wt 165.1 lb

## 2024-04-16 DIAGNOSIS — I739 Peripheral vascular disease, unspecified: Secondary | ICD-10-CM

## 2024-04-16 DIAGNOSIS — F17200 Nicotine dependence, unspecified, uncomplicated: Secondary | ICD-10-CM

## 2024-04-16 DIAGNOSIS — I70213 Atherosclerosis of native arteries of extremities with intermittent claudication, bilateral legs: Secondary | ICD-10-CM

## 2024-04-16 NOTE — Progress Notes (Signed)
 " Office Note     CC:  follow up Requesting Provider:  Purcell Emil Schanz, MD  HPI: Walter Odom is a 64 y.o. ([birth year]) male who presents for surveillance of PAD.  He has bilateral lower extremity claudication involving his calves when walking up stairs or long distances.  He has history of angiogram in 01/08/2017 by Dr. Eliza and found to have an occluded left SFA however was not able to cross and treat the lesion.  He denies any rest pain or tissue loss of bilateral lower extremities.  His work requires him to be on his feet throughout most of the day.  He smokes about 6 to 7 cigarettes every day.  He is on aspirin  and statin daily.  Currently his symptoms are tolerable.   Past Medical History:  Diagnosis Date   Hyperlipidemia    Hypertension     Past Surgical History:  Procedure Laterality Date   ABDOMINAL AORTOGRAM W/LOWER EXTREMITY N/A 01/08/2017   Procedure: ABDOMINAL AORTOGRAM W/LOWER EXTREMITY;  Surgeon: Eliza Lonni RAMAN, MD;  Location: [facility] INVASIVE CV LAB;  Service: Cardiovascular;  Laterality: N/A;   PERIPHERAL VASCULAR BALLOON ANGIOPLASTY Left 01/08/2017   Procedure: PERIPHERAL VASCULAR BALLOON ANGIOPLASTY UNSUCCESSFUL;  Surgeon: Eliza Lonni RAMAN, MD;  Location: [facility] INVASIVE CV LAB;  Service: Cardiovascular;  Laterality: Left;    Social History   Socioeconomic History   Marital status: Married    Spouse name: Not on file   Number of children: Not on file   Years of education: Not on file   Highest education level: Not on file  Occupational History    Comment: [employer]  Tobacco Use   Smoking status: Every Day    Current packs/day: 0.50    Types: Cigarettes   Smokeless tobacco: Never  Vaping Use   Vaping status: Never Used  Substance and Sexual Activity   Alcohol use: Yes    Comment: ocassional   Drug use: Yes    Types: Marijuana   Sexual activity: Not on file  Other Topics Concern   Not on file  Social History Narrative   pt  reports working for [employer] for over 30 years   Social Drivers of Health   Tobacco Use: High Risk (04/16/2024)   Patient History    Smoking Tobacco Use: Every Day    Smokeless Tobacco Use: Never    Passive Exposure: Not on file  Financial Resource Strain: Not on file  Food Insecurity: Not on file  Transportation Needs: Not on file  Physical Activity: Not on file  Stress: Not on file  Social Connections: Not on file  Intimate Partner Violence: Not on file  Depression (PHQ2-9): Low Risk (12/18/2023)   Depression (PHQ2-9)    PHQ-2 Score: 0  Alcohol Screen: Not on file  Housing: Not on file  Utilities: Not on file  Health Literacy: Not on file    Family History  Problem Relation Age of Onset   Colon cancer Neg Hx    Colon polyps Neg Hx    Esophageal cancer Neg Hx    Rectal cancer Neg Hx    Stomach cancer Neg Hx     Current Outpatient Medications  Medication Sig Dispense Refill   amLODipine  (NORVASC ) 10 MG tablet Take 1 tablet (10 mg total) by mouth daily. 90 tablet 3   aspirin  EC 81 MG tablet Take 81 mg by mouth daily. Swallow whole.     cyclobenzaprine  (FLEXERIL ) 10 MG tablet Take 1 tablet (10 mg total) by  mouth at bedtime. 30 tablet 0   diclofenac  (VOLTAREN ) 75 MG EC tablet Take 1 tablet (75 mg total) by mouth 2 (two) times daily as needed. 30 tablet 0   rosuvastatin  (CRESTOR ) 10 MG tablet Take 1 tablet (10 mg total) by mouth daily. 90 tablet 3   No current facility-administered medications for this visit.    Allergies[1]   REVIEW OF SYSTEMS:  Negative unless noted in HPI [X]  denotes positive finding, [ ]  denotes negative finding Cardiac  Comments:  Chest pain or chest pressure:    Shortness of breath upon exertion:    Short of breath when lying flat:    Irregular heart rhythm:        Vascular    Pain in calf, thigh, or hip brought on by ambulation:    Pain in feet at night that wakes you up from your sleep:     Blood clot in your veins:    Leg swelling:          Pulmonary    Oxygen at home:    Productive cough:     Wheezing:         Neurologic    Sudden weakness in arms or legs:     Sudden numbness in arms or legs:     Sudden onset of difficulty speaking or slurred speech:    Temporary loss of vision in one eye:     Problems with dizziness:         Gastrointestinal    Blood in stool:     Vomited blood:         Genitourinary    Burning when urinating:     Blood in urine:        Psychiatric    Major depression:         Hematologic    Bleeding problems:    Problems with blood clotting too easily:        Skin    Rashes or ulcers:        Constitutional    Fever or chills:      PHYSICAL EXAMINATION:  Vitals:   04/16/24 0843  BP: 123/80  Pulse: 67  Weight: 165 lb 1.6 oz (74.9 kg)  Height: 6' (1.829 m)    General:  WDWN in NAD; vital signs documented above Gait: Not observed HENT: WNL, normocephalic Pulmonary: normal non-labored breathing Cardiac: regular HR Abdomen: soft, NT, no masses Skin: without rashes Vascular Exam/Pulses: absent pedal pulses Extremities: without ischemic changes, without Gangrene , without cellulitis; without open wounds;  Musculoskeletal: no muscle wasting or atrophy  Neurologic: A&O X 3 Psychiatric:  The pt has Normal affect.   Non-Invasive Vascular Imaging:   ABI/TBIToday's ABIToday's TBIPrevious ABIPrevious TBI  +-------+-----------+-----------+------------+------------+  Right 0.59       0.38       0.57        0.31          +-------+-----------+-----------+------------+------------+  Left  0.67       0.42       0.68        0.38       ASSESSMENT/PLAN:: 64 y.o. male here for follow up for surveillance of PAD  Walter Odom is a 64 year old male with claudication symptoms in both calfs after walking up stairs or long flight distances.  Symptoms are currently tolerable and he is able to perform his job responsibilities.  He is without rest pain or tissue loss of  bilateral lower extremities.  ABIs  and TBI's are stable compared to last year.  He will continue his aspirin  and statin daily.  I have encouraged smoking cessation.  We will repeat ABIs in 1 year.  We will also check a right lower extremity arterial duplex at that time which will likely demonstrate an SFA occlusion.  He will notify the office if claudication symptoms worsen or if he develops rest pain or tissue loss.   Donnice Sender, PA-C [facility] [phone]  Clinic MD:   Sheree     [1] No Known Allergies  "

## 2024-04-17 ENCOUNTER — Other Ambulatory Visit: Payer: Self-pay | Admitting: *Deleted

## 2024-04-17 DIAGNOSIS — I739 Peripheral vascular disease, unspecified: Secondary | ICD-10-CM

## 2024-04-17 DIAGNOSIS — I70213 Atherosclerosis of native arteries of extremities with intermittent claudication, bilateral legs: Secondary | ICD-10-CM
